# Patient Record
Sex: Male | Born: 1940 | Marital: Married | Smoking: Former smoker
Health system: Southern US, Community
[De-identification: ages and names within clinical notes are randomized; demographics above are authoritative.]

## PROBLEM LIST (undated history)

## (undated) DIAGNOSIS — C801 Malignant (primary) neoplasm, unspecified: Secondary | ICD-10-CM

## (undated) DIAGNOSIS — I2510 Atherosclerotic heart disease of native coronary artery without angina pectoris: Secondary | ICD-10-CM

## (undated) DIAGNOSIS — E079 Disorder of thyroid, unspecified: Secondary | ICD-10-CM

## (undated) DIAGNOSIS — E039 Hypothyroidism, unspecified: Secondary | ICD-10-CM

## (undated) DIAGNOSIS — I10 Essential (primary) hypertension: Secondary | ICD-10-CM

## (undated) DIAGNOSIS — I509 Heart failure, unspecified: Secondary | ICD-10-CM

## (undated) DIAGNOSIS — I4892 Unspecified atrial flutter: Secondary | ICD-10-CM

## (undated) DIAGNOSIS — E785 Hyperlipidemia, unspecified: Secondary | ICD-10-CM

## (undated) HISTORY — DX: Essential (primary) hypertension: I10

## (undated) HISTORY — PX: SPLENECTOMY, TOTAL: SHX788

## (undated) HISTORY — PX: CARDIAC CATHETERIZATION: SHX172

## (undated) HISTORY — DX: Atherosclerotic heart disease of native coronary artery without angina pectoris: I25.10

## (undated) HISTORY — DX: Disorder of thyroid, unspecified: E07.9

## (undated) HISTORY — DX: Hyperlipidemia, unspecified: E78.5

## (undated) HISTORY — DX: Unspecified atrial flutter: I48.92

---

## 2011-05-16 ENCOUNTER — Ambulatory Visit: Payer: Self-pay | Admitting: Internal Medicine

## 2011-09-23 ENCOUNTER — Telehealth: Payer: Self-pay

## 2011-09-23 NOTE — Telephone Encounter (Signed)
Error Encounter

## 2011-09-26 ENCOUNTER — Encounter: Payer: Self-pay | Admitting: Internal Medicine

## 2011-09-26 ENCOUNTER — Ambulatory Visit (INDEPENDENT_AMBULATORY_CARE_PROVIDER_SITE_OTHER): Payer: Medicare PPO | Admitting: Internal Medicine

## 2011-09-26 VITALS — BP 120/70 | HR 85 | Ht 73.0 in | Wt 207.0 lb

## 2011-09-26 DIAGNOSIS — I2510 Atherosclerotic heart disease of native coronary artery without angina pectoris: Secondary | ICD-10-CM

## 2011-09-26 DIAGNOSIS — I4892 Unspecified atrial flutter: Secondary | ICD-10-CM

## 2011-09-26 NOTE — Patient Instructions (Signed)
STOP Aspirin.  STOP Vytorin for now. Continue Cardizem (Diltiazem) 120mg  for now.  We will contact you regarding future procedure. Please call the office in the meantime with any questions or concerns.

## 2011-09-26 NOTE — Progress Notes (Signed)
HPI: Steven Lewis is a 70 y.o. male Seen at the request of Dr. Gwen Pounds consideration of catheter ablation of atrial flutter. He was initially referred to [facility] EP team but insurance did not allow for him to be done at [facility] and so he is referred here.  He presented earlier this year with fatigue and exercise intolerance. He was found to be in atrial "fibrillation" although all tracings dating back to May 2012 demonstrate typical atrial flutter. In addition he was having symptoms of syncope and presyncope; and about 4 weeks ago all of the symptoms abated. He comes in today in sinus rhythm.  He also has a history of coronary artery disease with prior stenting x2. Myoview scan April 2012 demonstrated normal left ventricular function and no evidence of prior infarction or ischemia. Echo at the same time demonstrated moderate biatrial enlargement with an atrial dimension of 3.8. He had mild aortic stenosis and moderate pulmonary hypertension.  Thromboembolic risk factors are notable for age-one, vascular disease-1; diabetes-1 for a CHADS VASC score of 3. Current Outpatient Prescriptions  Medication Sig Dispense Refill  . aspirin 81 MG tablet Take 81 mg by mouth daily.        Marland Kitchen diltiazem (CARDIZEM CD) 120 MG 24 hr capsule Take 120 mg by mouth daily.        Marland Kitchen ezetimibe-simvastatin (VYTORIN) 10-40 MG per tablet Take 1 tablet by mouth at bedtime.        . fexofenadine (ALLEGRA) 180 MG tablet Take 180 mg by mouth daily.        Marland Kitchen levothyroxine (SYNTHROID, LEVOTHROID) 75 MCG tablet Take 75 mcg by mouth daily.        . metFORMIN (GLUCOPHAGE) 500 MG tablet Take 500 mg by mouth daily with breakfast.        . omega-3 acid ethyl esters (LOVAZA) 1 G capsule Take 2 g by mouth daily.        . Rivaroxaban (XARELTO) 20 MG TABS Take 20 mg by mouth 1 day or 1 dose.          Allergies  Allergen Reactions  . Penicillins     Past Medical History  Diagnosis Date  . Hypertension   . Hyperlipidemia   . Diabetes  mellitus   . Thyroid disease     hypothyroidism  . Atrial flutter   . A-fib   . Coronary artery disease     stent placed x 2    Past Surgical History  Procedure Date  . Splenectomy, total   . Cardiac catheterization     stent placed x2    Family History  Problem Relation Age of Onset  . Heart disease Mother     History   Social History  . Marital Status: Married    Spouse Name: N/A    Number of Children: N/A  . Years of Education: N/A   Occupational History  . Not on file.   Social History Main Topics  . Smoking status: Former Smoker -- 1.0 packs/day for 25 years    Types: Cigarettes    Quit date: 12/06/1981  . Smokeless tobacco: Not on file  . Alcohol Use: No  . Drug Use: No  . Sexually Active: Not on file   Other Topics Concern  . Not on file   Social History Narrative  . No narrative on file    Fourteen point review of systems was negative except as noted in HPI and PMH except mild arthritis  PHYSICAL EXAMINATION  Blood  pressure 120/70, pulse 85, height 6\' 1"  (1.854 m), weight 207 lb (93.895 kg).   Well developed and nourished older Caucasian male appearing his stated age in no acute distress HENT normal Neck supple with JVP-flat Carotids brisk and full without bruits Back without scoliosis or kyphosis Clear Regular rate and rhythm, no murmurs or gallops; P2 was palpable Abd-soft with active BS without hepatomegaly or midline pulsation; well-healed midline scar Femoral pulses 2+ distal pulses intact No Clubbing cyanosis edema Skin-warm and dry LN-neg submandibular and supraclavicular A & Oriented CN 3-12 normal  Grossly normal sensory and motor function Affect engaging . Electrocardiogram dated today demonstrated sinus rhythm at 89 Intervals 0.18/0.09/0.36 there is evidence of interatrial conduction delaythe P wave duration of 120 ms.  Electro cardiograms from [facility] in May and July 2012 demonstrate typical atrial flutter with 31 and 21 AV  conduction

## 2011-09-26 NOTE — Assessment & Plan Note (Addendum)
Atrial flutter is present on elective cardiograms; atrial fibrillation as described on his Myoview from April 2012. Will be important to gather this information and confirm the presence or absence of atrial fibrillation at that time. In the event that represents atrial flutter, I would pursue catheter ablation. We have discussed risks benefits and alternatives. The reduction risk of recurrence, a reduction in risk of stroke and inability to eliminate long-term anticoagulation are all reasons to proceed with catheter ablation. The patient is agreeable and desires to proceed.  We will discontinue his aspirin as it likely just augments risk of bleeding without increasing benefit associated with his xarelto  In addition, because of the diltiazem simvastatin interaction, we will hold his Vytorin until following the procedure at which time his diltiazem can be discontinued

## 2011-09-26 NOTE — Assessment & Plan Note (Signed)
Stable

## 2011-09-28 ENCOUNTER — Other Ambulatory Visit: Payer: Self-pay | Admitting: Internal Medicine

## 2011-09-28 DIAGNOSIS — I4892 Unspecified atrial flutter: Secondary | ICD-10-CM

## 2011-09-28 DIAGNOSIS — Z01810 Encounter for preprocedural cardiovascular examination: Secondary | ICD-10-CM

## 2011-10-03 ENCOUNTER — Ambulatory Visit (INDEPENDENT_AMBULATORY_CARE_PROVIDER_SITE_OTHER): Payer: Medicare PPO | Admitting: *Deleted

## 2011-10-03 ENCOUNTER — Telehealth: Payer: Self-pay | Admitting: *Deleted

## 2011-10-03 DIAGNOSIS — I4892 Unspecified atrial flutter: Secondary | ICD-10-CM

## 2011-10-03 DIAGNOSIS — Z01810 Encounter for preprocedural cardiovascular examination: Secondary | ICD-10-CM

## 2011-10-03 NOTE — Telephone Encounter (Signed)
Notified pt to also not take Metformin the morning of his procedure 10/10 (I left this out of his instructions on letter.) Pt aware.

## 2011-10-04 LAB — CBC WITH DIFFERENTIAL/PLATELET
Eosinophils Absolute: 0.8 10*3/uL — ABNORMAL HIGH (ref 0.0–0.7)
Hemoglobin: 13.7 g/dL (ref 13.0–17.0)
Lymphocytes Relative: 19 % (ref 12–46)
Lymphs Abs: 2.1 10*3/uL (ref 0.7–4.0)
MCH: 30.9 pg (ref 26.0–34.0)
MCV: 96.2 fL (ref 78.0–100.0)
Monocytes Relative: 15 % — ABNORMAL HIGH (ref 3–12)
Neutrophils Relative %: 58 % (ref 43–77)
RBC: 4.43 MIL/uL (ref 4.22–5.81)

## 2011-10-04 LAB — BASIC METABOLIC PANEL
Chloride: 103 mEq/L (ref 96–112)
Creat: 0.76 mg/dL (ref 0.50–1.35)
Potassium: 5.3 mEq/L (ref 3.5–5.3)

## 2011-10-04 LAB — PROTIME-INR
INR: 1.46 (ref <1.50)
Prothrombin Time: 18.3 seconds — ABNORMAL HIGH (ref 11.6–15.2)

## 2011-10-05 ENCOUNTER — Ambulatory Visit (HOSPITAL_COMMUNITY)
Admission: RE | Admit: 2011-10-05 | Discharge: 2011-10-06 | Disposition: A | Discharge status: Home / Self Care | Payer: Medicare PPO | Source: Ambulatory Visit | Attending: Internal Medicine | Admitting: Internal Medicine

## 2011-10-05 DIAGNOSIS — I2510 Atherosclerotic heart disease of native coronary artery without angina pectoris: Secondary | ICD-10-CM | POA: Insufficient documentation

## 2011-10-05 DIAGNOSIS — E119 Type 2 diabetes mellitus without complications: Secondary | ICD-10-CM | POA: Insufficient documentation

## 2011-10-05 DIAGNOSIS — Z9861 Coronary angioplasty status: Secondary | ICD-10-CM | POA: Insufficient documentation

## 2011-10-05 DIAGNOSIS — I4892 Unspecified atrial flutter: Secondary | ICD-10-CM

## 2011-10-05 DIAGNOSIS — E785 Hyperlipidemia, unspecified: Secondary | ICD-10-CM | POA: Insufficient documentation

## 2011-10-05 DIAGNOSIS — E039 Hypothyroidism, unspecified: Secondary | ICD-10-CM | POA: Insufficient documentation

## 2011-10-05 DIAGNOSIS — I10 Essential (primary) hypertension: Secondary | ICD-10-CM | POA: Insufficient documentation

## 2011-10-05 LAB — GLUCOSE, CAPILLARY
Glucose-Capillary: 113 mg/dL — ABNORMAL HIGH (ref 70–99)
Glucose-Capillary: 94 mg/dL (ref 70–99)
Glucose-Capillary: 121 mg/dL — ABNORMAL HIGH (ref 70–99)

## 2011-10-06 LAB — GLUCOSE, CAPILLARY

## 2011-10-21 NOTE — Op Note (Signed)
NAME:  Steven Lewis, GLASSCOCK NO.:  [id number]  MEDICAL RECORD NO.:  [id number]  LOCATION:  3707                         FACILITY:  [facility]  PHYSICIAN:  Duke Salvia, MD, FACCDATE OF BIRTH:  29-Jan-1941  DATE OF PROCEDURE: DATE OF DISCHARGE:                              OPERATIVE REPORT   Steven Lewis has a history of recurrent atrial flutter.  He has been previously on Xarelto.  He comes to the lab today in sinus rhythm for catheter ablation.  PREOPERATIVE DIAGNOSIS:  Atrial flutter.  POSTOPERATIVE DIAGNOSIS:  Atrial flutter.  PROCEDURES:  Invasive electrophysiological study, electrogram, mapping catheter ablation.  Following obtaining informed consent, the patient was brought to the Electrophysiology Laboratory and placed on the fluoroscopic table in supine position.  After routine prep and drape, cardiac catheterization was performed with local anesthesia and conscious sedation.  Noninvasive blood pressure monitoring and transcutaneous oxygen saturation monitoring were performed continuously throughout the procedure. Following the procedure, the catheters were removed.  Hemostasis was obtained.  The patient was transferred to the holding area in stable condition.  CATHETERS: 1. 5-French quadripolar catheter was inserted via the left femoral     vein to the AV junction. 2. 6-French octapolar catheter was inserted via the right femoral vein     to the coronary. 3. 7-French dual decapolar catheter was inserted via the left femoral     vein to the tricuspid anulus. 4. 8-French 8-mm deflectable tip catheter was inserted via the right     femoral vein to mapping sites and posterior septal space. Surface leads, I, aVF, and V1 were monitored continuously throughout the procedure.  Following insertion of the catheters, a stimulation protocol included incremental atrial pacing. Incremental ventricular pacing. Single atrial extrastimuli at paced cycle length  of 500 msec.  END-TIDAL RESULT:  End-tidal surface electrocardiogram and basic intervals: Rhythm:  Initial; RR interval 685 msec; rhythm:  Sinus; PR interval 148 msec; QRS duration:  94 msec; QTc interval:  359 msec. AH intervals 95 msec, HV interval 59 msec.  Final:  Rhythm:  Sinus; RR interval 709 msec; P-wave duration 125 msec; PR interval 156 msec; QRS duration 101 msec; acute interval 362 msec. AH interval of 121 msec; HV interval 57 msec.  END-TIDAL ARTERIOVENOUS NODAL FUNCTION:  AV Wenckebach of 400 msec. Retrograde conduction was dissociated at 500 msec. AV nodal effective refractory period of 500 msec at 320 msec without evidence of dual AV nodal physiology.  Accessory pathway function:  No evidence of accessory pathway was identified.  Ventricular response programed stimulation was normal for ventricular stimulation as described.  Arrhythmias induced.  The patient was arrived from the lab in sinus rhythm.  Coronary sinus pacing was used to identify cavotricuspid isthmus conduction.  Radiofrequency energy was delivered across the cavotricuspid isthmus with a total of 5 RF and a total of 3 minutes and 24 seconds of RF energy which resulted in bidirectional isthmus block and AA interval of 140 msec.  Fluoroscopy time, a total of 5.8 minutes of fluoroscopy time was utilized at 15 frames per second.  IMPRESSION: 1. Normal sinus function. 2. Abnormal atrial function manifested by sustained atrial flutter -     recurrent,  successfully submitted for ablation of the cavotricuspid     isthmus. 3. Normal arteriovenous nodal function. 4. Normal His-Purkinje system function. 5. No accessory pathway. 6. Normal ventricular response programed stimulation.  SUMMARY CONCLUSION:  The results of electrophysiological testing confirmed cavotricuspid isthmus conduction and its presents with a history of atrial flutter served as the substrate for arrhythmia ablation.  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  The patient's Xarelto will be discontinued.  The patient will be observed overnight and discharged in the morning.  No apparent complications.     Duke Salvia, MD, [facility]     SCK/MEDQ  D:  10/05/2011  T:  10/05/2011  Job:  [id number]  cc:   Arnoldo Hooker, MD  Electronically Signed by Sherryl Manges MD [facility] on 10/21/2011 04:54:09 AM

## 2011-10-21 NOTE — Discharge Summary (Signed)
NAME:  Steven, Lewis NO.:  [id number]  MEDICAL RECORD NO.:  [id number]  LOCATION:  3707                         FACILITY:  [facility]  PHYSICIAN:  Duke Salvia, MD, FACCDATE OF BIRTH:  Sep 29, 1941  DATE OF ADMISSION:  10/05/2011 DATE OF DISCHARGE:  10/06/2011                              DISCHARGE SUMMARY   PRIMARY CARDIOLOGIST:  Arnoldo Hooker, MD  ELECTROPHYSIOLOGIST:  Duke Salvia, MD, [facility]  PRIMARY DIAGNOSIS:  Atrial flutter status post ablation this admission.  SECONDARY DIAGNOSES: 1. Coronary artery disease status post stenting x2 with most recent     Myoview in April 2012 demonstrating a normal left ventricular     function and no evidence of prior infarct or ischemia. 2. Hypertension. 3. Hyperlipidemia. 4. Diabetes. 5. Hypothyroidism. 6. Status post splenectomy.  ALLERGIES:  The patient is allergic to PENICILLIN.  PROCEDURES THIS ADMISSION:  Electrophysiology study and radiofrequency catheter ablation of atrial flutter on October 05, 2011 by Dr. Graciela Husbands. This demonstrated cavotricuspid isthmus conduction that was successfully ablated.  The patient had no early apparent complications.  BRIEF HISTORY OF PRESENT ILLNESS:  Steven Lewis was referred to Dr. Graciela Husbands in the outpatient setting for consideration of catheter ablation of atrial flutter.  He presented earlier this year with fatigue and exercise intolerance and was found to be in "atrial fibrillation," although tracing reviewed by Dr. Graciela Husbands demonstrated typical atrial flutter.  He was also having symptoms of syncope and presyncope.  About 4 weeks prior to evaluation symptoms, his abated and he presented to Dr. Graciela Husbands in the outpatient setting in sinus rhythm.  Risks benefits alternatives of ablation were reviewed with the patient and he wished to proceed.  HOSPITAL COURSE:  The patient was admitted on October 05, 2011 for planned ablation of atrial flutter.  This was carried out by Dr.  Graciela Husbands with details outlined above.  He was monitored on telemetry overnight which demonstrated sinus rhythm.  His groin incisions were without hematoma or bruit.  Dr. Graciela Husbands examined the patient on October 06, 2011 and considered him stable for discharge to home.  Plans will be to discontinue Xarelto and diltiazem and continue aspirin for 6 weeks.  The patient will follow up with Dr. Graciela Husbands in the [place] office in 4 weeks.  FOLLOWUP APPOINTMENTS: 1. Dr. Graciela Husbands in the [facility] office on November 14, 2011 at     10:30 a.m. 2. Dr. Gwen Pounds as scheduled.  DISCHARGE INSTRUCTIONS: 1. Increase activity slowly. 2. No driving for 3 days. 3. Follow a low-sodium heart healthy diet. 4. Keep incisions clean and dry.  DISCHARGE MEDICATIONS: 1. Aspirin 81 mg 1 tablet daily. 2. Multivitamin daily. 3. Levothroid 75 mcg daily. 4. Lipitor 80 mg daily. 5. Metformin 500 mg daily. 6. Fexofenadine 180 mg daily.  DISPOSITION:  The patient was seen and examined by Dr. Graciela Husbands on October 06, 2011 and considered stable for discharge.  DURATION OF DISCHARGE ENCOUNTER:  35 minutes.     Gypsy Balsam, RN,BSN   ______________________________ Duke Salvia, MD, [facility]    AS/MEDQ  D:  10/06/2011  T:  10/06/2011  Job:  [id number]  cc:   Arnoldo Hooker, MD  Electronically Signed by Gypsy Balsam  RNBSN on 10/10/2011 10:16:14 AM Electronically Signed by Sherryl Manges MD [facility] on 10/21/2011 45:40:98 AM

## 2011-11-14 ENCOUNTER — Encounter: Payer: Medicare PPO | Admitting: Internal Medicine

## 2012-10-25 ENCOUNTER — Ambulatory Visit: Payer: Self-pay | Admitting: Unknown Physician Specialty

## 2015-06-18 ENCOUNTER — Telehealth: Payer: Self-pay | Admitting: Family Medicine

## 2015-06-18 DIAGNOSIS — J3089 Other allergic rhinitis: Secondary | ICD-10-CM

## 2015-06-18 MED ORDER — FLUTICASONE PROPIONATE 50 MCG/ACT NA SUSP: 2.0000 | Freq: Every day | NASAL | Status: DC

## 2015-06-18 NOTE — Telephone Encounter (Signed)
Pt called wants to know if Dr. Jeananne Rama can call in Flonase nasal spray for him. Pharm is [facility].

## 2015-06-23 ENCOUNTER — Other Ambulatory Visit: Payer: Self-pay | Admitting: Family Medicine

## 2015-06-23 DIAGNOSIS — J3089 Other allergic rhinitis: Secondary | ICD-10-CM

## 2015-06-23 MED ORDER — FLUTICASONE PROPIONATE 50 MCG/ACT NA SUSP: 2.0000 | Freq: Every day | NASAL | Status: AC

## 2015-07-03 ENCOUNTER — Encounter: Payer: Self-pay | Admitting: Emergency Medicine

## 2015-07-03 ENCOUNTER — Inpatient Hospital Stay
Admission: EM | Admit: 2015-07-03 | Discharge: 2015-07-04 | DRG: 176 | Disposition: A | Discharge status: Hospice | Payer: PPO | Attending: Internal Medicine | Admitting: Internal Medicine

## 2015-07-03 ENCOUNTER — Inpatient Hospital Stay
Admit: 2015-07-03 | Discharge: 2015-07-03 | Disposition: A | Discharge status: Home / Self Care | Payer: PPO | Attending: Internal Medicine | Admitting: Internal Medicine

## 2015-07-03 ENCOUNTER — Emergency Department: Payer: PPO

## 2015-07-03 DIAGNOSIS — Z803 Family history of malignant neoplasm of breast: Secondary | ICD-10-CM

## 2015-07-03 DIAGNOSIS — I10 Essential (primary) hypertension: Secondary | ICD-10-CM | POA: Diagnosis present

## 2015-07-03 DIAGNOSIS — Z800 Family history of malignant neoplasm of digestive organs: Secondary | ICD-10-CM

## 2015-07-03 DIAGNOSIS — N2889 Other specified disorders of kidney and ureter: Secondary | ICD-10-CM | POA: Diagnosis present

## 2015-07-03 DIAGNOSIS — Z794 Long term (current) use of insulin: Secondary | ICD-10-CM

## 2015-07-03 DIAGNOSIS — R197 Diarrhea, unspecified: Secondary | ICD-10-CM

## 2015-07-03 DIAGNOSIS — R1900 Intra-abdominal and pelvic swelling, mass and lump, unspecified site: Secondary | ICD-10-CM | POA: Diagnosis present

## 2015-07-03 DIAGNOSIS — E785 Hyperlipidemia, unspecified: Secondary | ICD-10-CM | POA: Diagnosis present

## 2015-07-03 DIAGNOSIS — Z79899 Other long term (current) drug therapy: Secondary | ICD-10-CM

## 2015-07-03 DIAGNOSIS — I5032 Chronic diastolic (congestive) heart failure: Secondary | ICD-10-CM | POA: Diagnosis present

## 2015-07-03 DIAGNOSIS — Z7982 Long term (current) use of aspirin: Secondary | ICD-10-CM | POA: Diagnosis not present

## 2015-07-03 DIAGNOSIS — Z955 Presence of coronary angioplasty implant and graft: Secondary | ICD-10-CM | POA: Diagnosis not present

## 2015-07-03 DIAGNOSIS — I2510 Atherosclerotic heart disease of native coronary artery without angina pectoris: Secondary | ICD-10-CM | POA: Diagnosis present

## 2015-07-03 DIAGNOSIS — Z809 Family history of malignant neoplasm, unspecified: Secondary | ICD-10-CM | POA: Diagnosis not present

## 2015-07-03 DIAGNOSIS — R0602 Shortness of breath: Secondary | ICD-10-CM

## 2015-07-03 DIAGNOSIS — I4892 Unspecified atrial flutter: Secondary | ICD-10-CM | POA: Diagnosis present

## 2015-07-03 DIAGNOSIS — E079 Disorder of thyroid, unspecified: Secondary | ICD-10-CM

## 2015-07-03 DIAGNOSIS — Z880 Allergy status to penicillin: Secondary | ICD-10-CM | POA: Diagnosis not present

## 2015-07-03 DIAGNOSIS — I4891 Unspecified atrial fibrillation: Secondary | ICD-10-CM

## 2015-07-03 DIAGNOSIS — K8689 Other specified diseases of pancreas: Secondary | ICD-10-CM

## 2015-07-03 DIAGNOSIS — E039 Hypothyroidism, unspecified: Secondary | ICD-10-CM | POA: Diagnosis present

## 2015-07-03 DIAGNOSIS — R918 Other nonspecific abnormal finding of lung field: Secondary | ICD-10-CM | POA: Diagnosis present

## 2015-07-03 DIAGNOSIS — C787 Secondary malignant neoplasm of liver and intrahepatic bile duct: Secondary | ICD-10-CM | POA: Diagnosis present

## 2015-07-03 DIAGNOSIS — K869 Disease of pancreas, unspecified: Secondary | ICD-10-CM | POA: Diagnosis not present

## 2015-07-03 DIAGNOSIS — C251 Malignant neoplasm of body of pancreas: Secondary | ICD-10-CM | POA: Diagnosis present

## 2015-07-03 DIAGNOSIS — Z87891 Personal history of nicotine dependence: Secondary | ICD-10-CM

## 2015-07-03 DIAGNOSIS — I480 Paroxysmal atrial fibrillation: Secondary | ICD-10-CM | POA: Diagnosis present

## 2015-07-03 DIAGNOSIS — F17210 Nicotine dependence, cigarettes, uncomplicated: Secondary | ICD-10-CM

## 2015-07-03 DIAGNOSIS — I5189 Other ill-defined heart diseases: Secondary | ICD-10-CM | POA: Diagnosis present

## 2015-07-03 DIAGNOSIS — R531 Weakness: Secondary | ICD-10-CM

## 2015-07-03 DIAGNOSIS — I2699 Other pulmonary embolism without acute cor pulmonale: Secondary | ICD-10-CM | POA: Diagnosis not present

## 2015-07-03 DIAGNOSIS — R630 Anorexia: Secondary | ICD-10-CM

## 2015-07-03 DIAGNOSIS — E119 Type 2 diabetes mellitus without complications: Secondary | ICD-10-CM | POA: Diagnosis present

## 2015-07-03 DIAGNOSIS — I429 Cardiomyopathy, unspecified: Secondary | ICD-10-CM

## 2015-07-03 DIAGNOSIS — R5383 Other fatigue: Secondary | ICD-10-CM

## 2015-07-03 HISTORY — DX: Hypothyroidism, unspecified: E03.9

## 2015-07-03 HISTORY — DX: Heart failure, unspecified: I50.9

## 2015-07-03 HISTORY — DX: Malignant (primary) neoplasm, unspecified: C80.1

## 2015-07-03 LAB — COMPREHENSIVE METABOLIC PANEL
ALBUMIN: 3.6 g/dL (ref 3.5–5.0)
ALK PHOS: 325 U/L — AB (ref 38–126)
ALT: 38 U/L (ref 17–63)
ANION GAP: 10 (ref 5–15)
AST: 48 U/L — ABNORMAL HIGH (ref 15–41)
BUN: 30 mg/dL — AB (ref 6–20)
CALCIUM: 8.9 mg/dL (ref 8.9–10.3)
CO2: 27 mmol/L (ref 22–32)
Chloride: 99 mmol/L — ABNORMAL LOW (ref 101–111)
Creatinine, Ser: 0.99 mg/dL (ref 0.61–1.24)
GFR calc Af Amer: >60 mL/min (ref >60)
GFR calc non Af Amer: >60 mL/min (ref >60)
Glucose, Bld: 175 mg/dL — ABNORMAL HIGH (ref 65–99)
POTASSIUM: 4.9 mmol/L (ref 3.5–5.1)
Sodium: 136 mmol/L (ref 135–145)
TOTAL PROTEIN: 8 g/dL (ref 6.5–8.1)
Total Bilirubin: 0.9 mg/dL (ref 0.3–1.2)

## 2015-07-03 LAB — CBC WITH DIFFERENTIAL/PLATELET
Basophils Absolute: 0.1 10*3/uL (ref 0–0.1)
Basophils Relative: 1 %
EOS PCT: 0 %
Eosinophils Absolute: 0 10*3/uL (ref 0–0.7)
HCT: 37.9 % — ABNORMAL LOW (ref 40.0–52.0)
Hemoglobin: 12.3 g/dL — ABNORMAL LOW (ref 13.0–18.0)
LYMPHS ABS: 1 10*3/uL (ref 1.0–3.6)
LYMPHS PCT: 10 %
MCH: 30.4 pg (ref 26.0–34.0)
MCHC: 32.5 g/dL (ref 32.0–36.0)
MCV: 93.6 fL (ref 80.0–100.0)
Monocytes Absolute: 1.2 10*3/uL — ABNORMAL HIGH (ref 0.2–1.0)
Monocytes Relative: 11 %
NEUTROS PCT: 78 %
Neutro Abs: 8 10*3/uL — ABNORMAL HIGH (ref 1.4–6.5)
PLATELETS: 309 10*3/uL (ref 150–440)
RBC: 4.05 MIL/uL — AB (ref 4.40–5.90)
RDW: 14.2 % (ref 11.5–14.5)
WBC: 10.3 10*3/uL (ref 3.8–10.6)

## 2015-07-03 LAB — GLUCOSE, CAPILLARY
Glucose-Capillary: 118 mg/dL — ABNORMAL HIGH (ref 65–99)
Glucose-Capillary: 155 mg/dL — ABNORMAL HIGH (ref 65–99)

## 2015-07-03 LAB — TROPONIN I: Troponin I: 0.04 ng/mL — ABNORMAL HIGH (ref <0.031)

## 2015-07-03 LAB — TSH: TSH: 4.245 u[IU]/mL (ref 0.350–4.500)

## 2015-07-03 LAB — HEPARIN LEVEL (UNFRACTIONATED): HEPARIN UNFRACTIONATED: 0.58 [IU]/mL (ref 0.30–0.70)

## 2015-07-03 LAB — APTT: APTT: 29 s (ref 24–36)

## 2015-07-03 LAB — PROTIME-INR
INR: 1
Prothrombin Time: 13.4 seconds (ref 11.4–15.0)

## 2015-07-03 MED ORDER — ASPIRIN 81 MG PO CHEW
324.0000 mg | CHEWABLE_TABLET | Freq: Once | ORAL | Status: AC
  Administered 2015-07-03: 324 mg via ORAL
  Filled 2015-07-03: qty 4

## 2015-07-03 MED ORDER — LEVOTHYROXINE SODIUM 75 MCG PO TABS
75.0000 ug | ORAL_TABLET | Freq: Every day | ORAL | Status: DC
  Administered 2015-07-04: 75 ug via ORAL
  Filled 2015-07-03: qty 1

## 2015-07-03 MED ORDER — ACETAMINOPHEN 325 MG PO TABS
650.0000 mg | ORAL_TABLET | Freq: Four times a day (QID) | ORAL | Status: DC | PRN
Start: 2015-07-03 — End: 2015-07-04

## 2015-07-03 MED ORDER — ASPIRIN EC 81 MG PO TBEC
81.0000 mg | DELAYED_RELEASE_TABLET | Freq: Every day | ORAL | Status: DC
  Administered 2015-07-04: 81 mg via ORAL
  Filled 2015-07-03: qty 1

## 2015-07-03 MED ORDER — HEPARIN (PORCINE) IN NACL 100-0.45 UNIT/ML-% IJ SOLN
1250.0000 [IU]/h | INTRAMUSCULAR | Status: DC
  Filled 2015-07-03 (×2): qty 250

## 2015-07-03 MED ORDER — ACETAMINOPHEN 650 MG RE SUPP: 650.0000 mg | Freq: Four times a day (QID) | RECTAL | Status: DC | PRN

## 2015-07-03 MED ORDER — SODIUM CHLORIDE 0.9 % IJ SOLN
3.0000 mL | Freq: Two times a day (BID) | INTRAMUSCULAR | Status: DC
  Administered 2015-07-03 – 2015-07-04 (×3): 3 mL via INTRAVENOUS

## 2015-07-03 MED ORDER — DILTIAZEM HCL ER COATED BEADS 120 MG PO CP24
120.0000 mg | ORAL_CAPSULE | Freq: Every day | ORAL | Status: DC
  Administered 2015-07-04: 120 mg via ORAL
  Filled 2015-07-03: qty 1

## 2015-07-03 MED ORDER — LORATADINE 10 MG PO TABS
10.0000 mg | ORAL_TABLET | Freq: Every day | ORAL | Status: DC
  Administered 2015-07-04: 10 mg via ORAL
  Filled 2015-07-03: qty 1

## 2015-07-03 MED ORDER — IOHEXOL 350 MG/ML SOLN
100.0000 mL | Freq: Once | INTRAVENOUS | Status: AC | PRN
  Administered 2015-07-03: 100 mL via INTRAVENOUS

## 2015-07-03 MED ORDER — MAGNESIUM HYDROXIDE 400 MG/5ML PO SUSP: 30.0000 mL | Freq: Every day | ORAL | Status: DC | PRN

## 2015-07-03 MED ORDER — ENOXAPARIN SODIUM 80 MG/0.8ML ~~LOC~~ SOLN
1.0000 mg/kg | Freq: Two times a day (BID) | SUBCUTANEOUS | Status: DC
  Administered 2015-07-03 – 2015-07-04 (×2): 75 mg via SUBCUTANEOUS
  Filled 2015-07-03 (×4): qty 0.8

## 2015-07-03 MED ORDER — OMEGA-3-ACID ETHYL ESTERS 1 G PO CAPS
2.0000 g | ORAL_CAPSULE | Freq: Every day | ORAL | Status: DC
  Administered 2015-07-04: 2 g via ORAL
  Filled 2015-07-03: qty 2

## 2015-07-03 MED ORDER — INSULIN ASPART 100 UNIT/ML ~~LOC~~ SOLN: 0.0000 [IU] | Freq: Every day | SUBCUTANEOUS | Status: DC

## 2015-07-03 MED ORDER — ALUM & MAG HYDROXIDE-SIMETH 200-200-20 MG/5ML PO SUSP: 30.0000 mL | Freq: Four times a day (QID) | ORAL | Status: DC | PRN

## 2015-07-03 MED ORDER — INSULIN ASPART 100 UNIT/ML ~~LOC~~ SOLN
0.0000 [IU] | Freq: Three times a day (TID) | SUBCUTANEOUS | Status: DC
  Administered 2015-07-04: 1 [IU] via SUBCUTANEOUS
  Filled 2015-07-03: qty 1

## 2015-07-03 MED ORDER — IOHEXOL 240 MG/ML SOLN
25.0000 mL | Freq: Once | INTRAMUSCULAR | Status: AC | PRN
  Administered 2015-07-03: 25 mL via ORAL

## 2015-07-03 MED ORDER — PANTOPRAZOLE SODIUM 40 MG PO TBEC
40.0000 mg | DELAYED_RELEASE_TABLET | Freq: Every day | ORAL | Status: DC
  Administered 2015-07-04: 40 mg via ORAL
  Filled 2015-07-03: qty 1

## 2015-07-03 MED ORDER — HEPARIN BOLUS VIA INFUSION
4500.0000 [IU] | Freq: Once | INTRAVENOUS | Status: DC
  Filled 2015-07-03: qty 4500

## 2015-07-03 MED ORDER — FLUTICASONE PROPIONATE 50 MCG/ACT NA SUSP
2.0000 | Freq: Every day | NASAL | Status: DC
  Administered 2015-07-04: 2 via NASAL
  Filled 2015-07-03: qty 16

## 2015-07-03 NOTE — ED Notes (Signed)
Pt with shortness of breath and diarrhea for three weeks.

## 2015-07-03 NOTE — Progress Notes (Signed)
   07/03/15 1700  Clinical Encounter Type  Visited With Patient  Visit Type Initial;Psychological support;Social support  Referral From Nurse  Consult/Referral To Chaplain  Spiritual Encounters  Spiritual Needs Emotional;Grief support  Stress Factors  Patient Stress Factors Major life changes;Loss of control;Health changes;Loss  Met w/patient and provided compassionate company to listen.  Chap. Danny G. Nobles, ext. [phone] 

## 2015-07-03 NOTE — ED Provider Notes (Signed)
[facility] Emergency Department Provider Note   ____________________________________________  Time seen: 10:45 AM I have reviewed the triage vital signs and the triage nursing note.  HISTORY  Chief Complaint Diarrhea   Historian Patient and significant other  HPI Steven Lewis is a 74 y.o. male who is here complaining of shortness of breath, abdominal pain with nausea and diarrhea for about 3 weeks, and an 80 pound weight loss over the past year. He is followed with GI in the past but states he hasn't been seen there for about a year and a half. He tried to get an appointment was to be 6+ weeks. He's had problems with constipation alternating with diarrhea. The last 3 weeks have been very bad with diarrhea, decreased by mouth intake due to no appetite, as well as abdominal fullness and epigastric pain associated also with nausea. There is been no vomiting. There is been no black or bloody stools. He reports some shortness of breath which has been chronic but seems to be much worse over the past several weeks. He's had no cough or fever or sputum production. 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    Past Medical History  Diagnosis Date  . Hyperlipidemia   . Diabetes mellitus   . Thyroid disease     hypothyroidism  . Atrial flutter   . Coronary artery disease     stent placed x 2  . Cancer   . CHF (congestive heart failure)     Patient Active Problem List   Diagnosis Date Noted  . Atrial flutter   . Coronary artery disease     Past Surgical History  Procedure Laterality Date  . Splenectomy, total    . Cardiac catheterization      stent placed x2    Current Outpatient Rx  Name  Route  Sig  Dispense  Refill  . aspirin EC 81 MG tablet   Oral   Take 81 mg by mouth daily.         Marland Kitchen diltiazem (CARDIZEM CD) 120 MG 24 hr capsule   Oral   Take 120 mg by mouth daily.           . fexofenadine (ALLEGRA) 180 MG  tablet   Oral   Take 180 mg by mouth daily.           . fluticasone (FLONASE) 50 MCG/ACT nasal spray   Each Nare   Place 2 sprays into both nostrils daily.   16 g   12   . levothyroxine (SYNTHROID, LEVOTHROID) 75 MCG tablet   Oral   Take 75 mcg by mouth daily.           . metFORMIN (GLUCOPHAGE) 500 MG tablet   Oral   Take 500 mg by mouth daily with breakfast.           . omega-3 acid ethyl esters (LOVAZA) 1 G capsule   Oral   Take 2 g by mouth daily.             Allergies Penicillins  Family History  Problem Relation Age of Onset  . Heart disease Mother     Social History History  Substance Use Topics  . Smoking status: Former Smoker -- 1.00 packs/day for 25 years    Types: Cigarettes    Quit date: 12/06/1981  . Smokeless tobacco: Not on file  . Alcohol Use: No    Review of Systems  Constitutional:  Negative for fever. Eyes: Negative for visual changes. ENT: Negative for sore throat. Cardiovascular: Negative for chest pain. Respiratory: Positive for shortness of breath, per history of present illness. Gastrointestinal: Positive for nausea, abdominal pain in the epigastrium, and diarrhea without any vomiting as per history of present illness Genitourinary: Negative for dysuria. Musculoskeletal: Negative for back pain. Skin: Negative for rash. Neurological: Negative for headaches, focal weakness or numbness. 10 point Review of Systems otherwise negative ____________________________________________   PHYSICAL EXAM:  VITAL SIGNS: ED Triage Vitals  Enc Vitals Group     BP 07/03/15 0937 127/69 mmHg     Pulse Rate 07/03/15 0937 112     Resp 07/03/15 0937 18     Temp 07/03/15 0937 97.6 F (36.4 C)     Temp Source 07/03/15 0937 Oral     SpO2 07/03/15 0937 98 %     Weight 07/03/15 0937 164 lb (74.39 kg)     Height 07/03/15 0937 6\' 1"  (1.854 m)     Head Cir --      Peak Flow --      Pain Score 07/03/15 0938 2     Pain Loc --      Pain Edu? --       Excl. in [place]? --      Constitutional: Alert and oriented. Short of breath with speech.. Eyes: Conjunctivae are normal. PERRL. Normal extraocular movements. ENT   Head: Normocephalic and atraumatic.   Nose: No congestion/rhinnorhea.   Mouth/Throat: Mucous membranes are moist.   Neck: No stridor. Cardiovascular/Chest: Tachycardic, but regular..  No murmurs, rubs, or gallops. Respiratory: No wheezes or rhonchi, however patient has decreased air movement throughout all fields with some tachypnea. There are no retractions. Patient is short of breath with speech. Gastrointestinal: Soft. No distention, no guarding, no rebound. Questionable mass versus scar tissue in the left upper quadrant over the site of prior spleen removal. Soft lower abdomen. Mild tenderness of the left side abdomen  Genitourinary/rectal:Deferred Musculoskeletal: Nontender with normal range of motion in all extremities. No joint effusions.  No lower extremity tenderness nor edema. Neurologic:  Normal speech and language. No gross focal neurologic deficits are appreciated. Skin:  Skin is warm, dry and intact. No rash noted. Psychiatric: Mood and affect are normal. Speech and behavior are normal. Patient exhibits appropriate insight and judgment.  ____________________________________________   EKG I, Lisa Roca, MD, the attending physician have personally viewed and interpreted all ECGs.  110 bpm. Sinus tachycardia. Narrow QRS. Left axis deviation. Nonspecific ST and T-wave. ____________________________________________  LABS (pertinent positives/negatives)  White blood cell count 10.3, hemoglobin 51.7 Metabolic panel significant for BUN of 30 but creatinine is just 0.99. Alkaline phosphatase 325, AST 48 with a normal a LT of 38 Troponin 0.04  ____________________________________________  RADIOLOGY All Xrays were viewed by me. Imaging interpreted by Radiologist. Discussed by phone with the reading  radiologist.  CT chest angiogram: Positive for pulmonary embolism with signs of right heart strain CT chest abdomen and pelvis with contrast: Multiple concerning findings including metastatic disease to the liver, pancreatic mass which may be primary pancreatic cancer versus adenocarcinoma, pleural effusions, ascites __________________________________________  PROCEDURES  Procedure(s) performed: None Critical Care performed: None  ____________________________________________   ED COURSE / ASSESSMENT AND PLAN  CONSULTATIONS: Face-to-face with hospitalist for admission  Pertinent labs & imaging results that were available during my care of the patient were reviewed by me and considered in my medical decision making (see chart for details).  Patient's symptoms were  concerning for possibility of abdominal mass, and shortness of breath led me to also CT the chest to rule out possible PE versus other lung pathology.  I discussed the CT scan results with the radiologist which shows multiple very concerning findings including new abdominal masses with widely metastatic disease as well as pulmonary emboli with signs of right heart strain. I ordered heparin. I discussed with the hospitalist for admission. I updated the patient and his significant other with this information.   Patient / Family / Caregiver informed of clinical course, medical decision-making process, and agree with plan.   I discussed return precautions, follow-up instructions, and discharged instructions with patient and/or family.  ___________________________________________   FINAL CLINICAL IMPRESSION(S) / ED DIAGNOSES   Final diagnoses:  Pulmonary embolism  Metastasis to liver  Pancreatic mass      Lisa Roca, MD 07/03/15 1302

## 2015-07-03 NOTE — Consult Note (Signed)
[facility]  CARDIOLOGY CONSULT NOTE  Patient ID: Steven Lewis MRN: [id number] DOB/AGE: 75-Sep-1942 75 y.o.  Admit date: 07/03/2015 Referring Physician Dr. Volanda Napoleon Primary Physician   Primary Cardiologist Dr. Nehemiah Massed Reason for Consultation pulmonary embolus  HPI: Patient is a 74 year old male with history of hypertensive heart disease. He also has a history of coronary artery disease status post PCI of the left circumflex. This was carried out in 2002. He has done fairly well since that time. The remainder of his vessels were insignificant. He has normal left ventricular function by echocardiogram with mild AI. Patient presented to the emergency room with several days of shortness of breath. He denied any recent lower extremity trauma or travel. In the emergency room he was evaluated with a chest CT which revealed bilateral pulmonary emboli. He was also noted to have mediastinal lymph nodes. There was also a 2.4 x 1.1 cm nodule in the right upper lobe of lung. There were multiple lesions within the liver consistent with metastatic disease. The largest of which was 9.2 x 9.1 cm in the left hepatic lobe. There was a 5.6 x 2.7 cm mass in the pancreatic body and tail. He also had ascites in the abdomen. Patient is hemodynamically stable. His serum troponin is normal at 0.031. EKG revealed sinus rhythm with RV strain pattern. There are no ischemic changes. Patient denies chest pain but complains of weight loss and shortness of breath.  ROS Review of Systems - History obtained from chart review and the patient General ROS: positive for  - fatigue and weight loss Respiratory ROS: positive for - shortness of breath Cardiovascular ROS: no chest pain or dyspnea on exertion Gastrointestinal ROS: no abdominal pain, change in bowel habits, or black or bloody stools Neurological ROS: no TIA or stroke symptoms   Past Medical History  Diagnosis Date  . Hyperlipidemia    . Diabetes mellitus   . Thyroid disease     hypothyroidism  . Atrial flutter   . Coronary artery disease     stent placed x 2  . Cancer   . CHF (congestive heart failure)   . Hypertension   . Hypothyroidism     Family History  Problem Relation Age of Onset  . Heart disease Mother   . Breast cancer Mother   . Cancer Father   . Colon cancer Sister     History   Social History  . Marital Status: Married    Spouse Name: N/A  . Number of Children: N/A  . Years of Education: N/A   Occupational History  . Not on file.   Social History Main Topics  . Smoking status: Former Smoker -- 1.00 packs/day for 25 years    Types: Cigarettes    Quit date: 12/06/1981  . Smokeless tobacco: Not on file  . Alcohol Use: No  . Drug Use: No  . Sexual Activity: Not on file   Other Topics Concern  . Not on file   Social History Narrative    Past Surgical History  Procedure Laterality Date  . Splenectomy, total    . Cardiac catheterization      stent placed x2     Prescriptions prior to admission  Medication Sig Dispense Refill Last Dose  . aspirin EC 81 MG tablet Take 81 mg by mouth daily.   07/03/2015 at Unknown time  . diltiazem (CARDIZEM CD) 120 MG 24 hr capsule Take 120 mg by mouth daily.  07/03/2015 at Unknown time  . fexofenadine (ALLEGRA) 180 MG tablet Take 180 mg by mouth daily.     07/03/2015 at Unknown time  . fluticasone (FLONASE) 50 MCG/ACT nasal spray Place 2 sprays into both nostrils daily. 16 g 12 07/03/2015 at Unknown time  . levothyroxine (SYNTHROID, LEVOTHROID) 75 MCG tablet Take 75 mcg by mouth daily.     07/03/2015 at Unknown time  . metFORMIN (GLUCOPHAGE) 500 MG tablet Take 500 mg by mouth daily with breakfast.     07/03/2015 at Unknown time  . omega-3 acid ethyl esters (LOVAZA) 1 G capsule Take 2 g by mouth daily.     07/03/2015 at Unknown time    Physical Exam: Blood pressure 131/70, pulse 106, temperature 98.3 F (36.8 C), temperature source Oral, resp. rate 18,  height 6\' 1"  (1.854 m), weight 73.12 kg (161 lb 3.2 oz), SpO2 95 %. .   General appearance: alert and cooperative Neck: no adenopathy, no carotid bruit, no JVD, supple, symmetrical, trachea midline and thyroid not enlarged, symmetric, no tenderness/mass/nodules Resp: clear to auscultation bilaterally Chest wall: no tenderness Cardio: regular rate and rhythm, S1, S2 normal, no murmur, click, rub or gallop and normal apical impulse GI: normal findings: bowel sounds normal and abnormal findings:  ascites Extremities: extremities normal, atraumatic, no cyanosis or edema Pulses: 2+ and symmetric Neurologic: Grossly normal Labs:   Lab Results  Component Value Date   WBC 10.3 07/03/2015   HGB 12.3* 07/03/2015   HCT 37.9* 07/03/2015   MCV 93.6 07/03/2015   PLT 309 07/03/2015    Recent Labs Lab 07/03/15 0939  NA 136  K 4.9  CL 99*  CO2 27  BUN 30*  CREATININE 0.99  CALCIUM 8.9  PROT 8.0  BILITOT 0.9  ALKPHOS 325*  ALT 38  AST 48*  GLUCOSE 175*   Lab Results  Component Value Date   TROPONINI 0.04* 07/03/2015      Radiology: As per above. Chest CT revealed bilateral pulmonary emboli with multiple abdominal and chest masses consistent with probable. EKG: Sinus rhythm and nonspecific ST-T wave changes  ASSESSMENT AND PLAN:  Patient with history of coronary disease however presents with fatigue and shortness of breath as well as weight loss. Chest CT reveals bilateral pulmonary emboli. Abdominal CT also revealed multiple masses in the lung, liver, pancreas. Likely metastatic disease. 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 Patient does not appear to be ischemic from a cardiac standpoint. RV strain is typical for pulmonary embolus. Trivial troponin elevation is also not unusual in a pulmonary embolus. Would continue to treat with Lovenox as you were doing. Workup of abdominal masses is appropriate. We'll follow along with you. Patient  does not appear to require invasive or noninvasive ischemic workup. Will review echocardiogram regarding LV and RV function when available Signed: Teodoro Spray MD, [facility] 07/03/2015, 3:50 PM

## 2015-07-03 NOTE — H&P (Signed)
[facility] [place] NAME: Steven Lewis    MR#:  [id number]  DATE OF BIRTH:  January 13, 1941  DATE OF ADMISSION:  07/03/2015  PRIMARY CARE PHYSICIAN: Golden Pop, MD   REQUESTING/REFERRING PHYSICIAN: Dr. Reita Cliche  CHIEF COMPLAINT:   Chief Complaint  Patient presents with  . Diarrhea    HISTORY OF PRESENT ILLNESS:  Steven Lewis  is a 74 y.o. male with a known history of coronary artery disease, diabetes, hypertension, hyperlipidemia, chronic diastolic dysfunction and paroxysmal atrial fibrillation who presents with nausea and diarrhea for about 3 weeks and about an 80 pound weight loss over the past year and a half. He states he has struggled with GI issues for about a year with alternating constipation and diarrhea. Symptoms have gotten worse over the past 3 weeks with constant diarrhea. No hematochezia or melanoma. He has had nausea but no vomiting. His appetite has been significantly decreased. For the past 2-3 days he's felt very weak and he has been very short of breath.   CT angiogram of the chest and CT of the abdomen were performed in the emergency room revealing bilateral submassive pulmonary emboli with evidence of right heart strain as well as what seems to be diffuse metastatic disease with masses throughout the liver, subpleural nodules, possible kidney mass and a low attenuation masslike area within the pancreatic body and tail. His last colonoscopy was in 2013. He has a strong family history of cancer in his mother father and sister.  PAST MEDICAL HISTORY:   Past Medical History  Diagnosis Date  . Hyperlipidemia   . Diabetes mellitus   . Thyroid disease     hypothyroidism  . Atrial flutter   . Coronary artery disease     stent placed x 2  . Cancer   . CHF (congestive heart failure)   . Hypertension   . Hypothyroidism     PAST SURGICAL HISTORY:   Past Surgical History  Procedure Laterality Date  . Splenectomy,  total    . Cardiac catheterization      stent placed x2    SOCIAL HISTORY:   History  Substance Use Topics  . Smoking status: Former Smoker -- 1.00 packs/day for 25 years    Types: Cigarettes    Quit date: 12/06/1981  . Smokeless tobacco: Not on file  . Alcohol Use: No    FAMILY HISTORY:   Family History  Problem Relation Age of Onset  . Heart disease Mother   . Breast cancer Mother   . Cancer Father   . Colon cancer Sister     DRUG ALLERGIES:   Allergies  Allergen Reactions  . Penicillins     REVIEW OF SYSTEMS:   Review of Systems  Constitutional: Positive for weight loss, malaise/fatigue and diaphoresis. Negative for fever and chills.  HENT: Negative for congestion and hearing loss.   Eyes: Negative for blurred vision and pain.  Respiratory: Positive for shortness of breath. Negative for cough, hemoptysis, sputum production, wheezing and stridor.   Cardiovascular: Negative for chest pain, palpitations, orthopnea and leg swelling.  Gastrointestinal: Positive for nausea and diarrhea. Negative for heartburn, vomiting, abdominal pain, constipation, blood in stool and melena.  Genitourinary: Negative for dysuria, urgency and frequency.  Musculoskeletal: Negative for myalgias, back pain, joint pain and neck pain.  Skin: Negative for rash.  Neurological: Positive for weakness. Negative for focal weakness, loss of consciousness and headaches.  Endo/Heme/Allergies: Does not bruise/bleed easily.  Psychiatric/Behavioral: Negative  for depression and hallucinations. The patient is not nervous/anxious.     MEDICATIONS AT HOME:   Prior to Admission medications   Medication Sig Start Date End Date Taking? Authorizing Provider  aspirin EC 81 MG tablet Take 81 mg by mouth daily.   Yes Historical Provider, MD  diltiazem (CARDIZEM CD) 120 MG 24 hr capsule Take 120 mg by mouth daily.     Yes Historical Provider, MD  fexofenadine (ALLEGRA) 180 MG tablet Take 180 mg by mouth daily.      Yes Historical Provider, MD  fluticasone (FLONASE) 50 MCG/ACT nasal spray Place 2 sprays into both nostrils daily. 06/23/15  Yes Guadalupe Maple, MD  levothyroxine (SYNTHROID, LEVOTHROID) 75 MCG tablet Take 75 mcg by mouth daily.     Yes Historical Provider, MD  metFORMIN (GLUCOPHAGE) 500 MG tablet Take 500 mg by mouth daily with breakfast.     Yes Historical Provider, MD  omega-3 acid ethyl esters (LOVAZA) 1 G capsule Take 2 g by mouth daily.     Yes Historical Provider, MD      VITAL SIGNS:  Blood pressure 119/70, pulse 99, temperature 97.6 F (36.4 C), temperature source Oral, resp. rate 18, height 6\' 1"  (1.854 m), weight 74.39 kg (164 lb), SpO2 95 %.  PHYSICAL EXAMINATION:  GENERAL:  74 y.o.-year-old patient lying in the bed with no acute distress.  EYES: Pupils equal, round, reactive to light and accommodation. No scleral icterus. Extraocular muscles intact.  HEENT: Head atraumatic, normocephalic. Oropharynx and nasopharynx clear. Mucous membranes intact, upper and lower dentures in place  NECK:  Supple, no jugular venous distention. No thyroid enlargement, no tenderness.  LUNGS: Normal breath sounds bilaterally, no wheezing, rales, rhonchi or crepitation. No use of accessory muscles of respiration.  CARDIOVASCULAR: S1, S2 normal. No murmurs, rubs, or gallops.  ABDOMEN: Soft, nontender, distended. Bowel sounds present. No organomegaly or mass. No guarding no rebound. Large midline abdominal scar  EXTREMITIES: No pedal edema, cyanosis, or clubbing. Peripheral pulses 2+  NEUROLOGIC: Cranial nerves II through XII are intact. Muscle strength 5/5 in all extremities. Sensation intact. Gait not checked.  PSYCHIATRIC: The patient is alert and oriented x 3. calm SKIN: No obvious rash, lesion, or ulcer.   LABORATORY PANEL:   CBC  Recent Labs Lab 07/03/15 0939  WBC 10.3  HGB 12.3*  HCT 37.9*  PLT 309    ------------------------------------------------------------------------------------------------------------------  Chemistries   Recent Labs Lab 07/03/15 0939  NA 136  K 4.9  CL 99*  CO2 27  GLUCOSE 175*  BUN 30*  CREATININE 0.99  CALCIUM 8.9  AST 48*  ALT 38  ALKPHOS 325*  BILITOT 0.9   ------------------------------------------------------------------------------------------------------------------  Cardiac Enzymes  Recent Labs Lab 07/03/15 0939  TROPONINI 0.04*   ------------------------------------------------------------------------------------------------------------------  RADIOLOGY:  Ct Angio Chest Pe W/cm &/or Wo Cm  07/03/2015   CLINICAL DATA:  Patient with shortness of breath which has worsened over the past week. Diffuse abdominal pain. 80 pound weight loss. Prior lymphoma and splenectomy.  EXAM: CT ANGIOGRAPHY CHEST  CT ABDOMEN AND PELVIS WITH CONTRAST  TECHNIQUE: Multidetector CT imaging of the chest was performed using the standard protocol during bolus administration of intravenous contrast. Multiplanar CT image reconstructions and MIPs were obtained to evaluate the vascular anatomy. Multidetector CT imaging of the abdomen and pelvis was performed using the standard protocol during bolus administration of intravenous contrast.  CONTRAST:  181mL OMNIPAQUE IOHEXOL 350 MG/ML SOLN  COMPARISON:  None.  FINDINGS: CTA CHEST FINDINGS  Mediastinum/Nodes: The  visualized thyroid is unremarkable. The heart is enlarged. Trace pericardial fluid. The ascending thoracic aorta is dilated measuring 4.1 cm. The main pulmonary artery is dilated measuring 4.1 cm. The esophagus is patulous and contains oral contrast and fluid. 10 mm right gastroesophageal lymph node (image 83; series 5). Calcified left supraclavicular lymph node measuring 10 mm.  Adequate opacification of the main pulmonary artery for the detection of pulmonary embolism. Acute embolism is demonstrated within the right  lower, right middle and right upper lobe segmental pulmonary arteries. Small amount of peripheral emboli demonstrated within the left lower lobe pulmonary arteries. The RV/LV ratio is 1.2 .  Lungs/Pleura: Central airways are patent. There is a moderate left pleural effusion. Consolidative opacities are demonstrated within the left lower lobe adjacent to the pleural effusion. Bandlike retracted consolidative opacities are demonstrated within the paramediastinal lungs bilaterally with associated bronchiectasis. This may represent sequelae of radiation therapy. Additionally within the peripheral right upper lobe there is a subpleural 2.4 x 1.1 cm nodule. Within the right middle lobe there is a 0.6 cm subpleural nodule. No pneumothorax.  Musculoskeletal: No aggressive or acute appearing osseous lesions. Multiple healed posterior left lateral lower rib fractures.  CT ABDOMEN and PELVIS FINDINGS  Hepatobiliary: There are innumerable low-attenuation lesions throughout the liver most compatible with metastatic disease. The largest of these lesions involves the left hepatic lobe and measures 9.2 x 9.1 cm. Reference low-attenuation lesion within the right hepatic lobe measures 2.9 x 2.4 cm (image 28; series 4). Gallbladder is decompressed.  Pancreas: Calcification within the pancreatic head. There is a 5.6 x 2.7 cm low-attenuation area involving the pancreatic body and tail (image 20; series 4) which is favored to represent dilated pancreatic duct.  Spleen: Surgically absent.  Adrenals/Urinary Tract: The adrenal glands are normal. Kidneys enhance symmetrically with contrast. Within the superior pole of the left kidney there is a 2.0 cm low-attenuation mass, indeterminate.  Stomach/Bowel: The colon is relatively decompressed. The appearance of the cecum is favored to be secondary to underdistention. No evidence for bowel obstruction. No free intraperitoneal air.  Vascular/Lymphatic: Infrarenal abdominal aortic ectasia  measuring 2.5 cm. Peripheral calcified atherosclerotic plaque. Multiple prominent and enlarged porta hepatic lymph nodes measuring up to 1.6 cm.  Other: There is a moderate to large amount of ascites throughout all 4 quadrants of the abdomen. Somewhat nodular appearance to the greater omentum. Central dystrophic calcifications in the prostate.  Musculoskeletal: No aggressive or acute appearing osseous lesions. Lower lumbar spine degenerative changes.  Review of the MIP images confirms the above findings.  IMPRESSION: Findings compatible with acute pulmonary embolism involving the right and left lungs particularly the segmental pulmonary arteries. Positive for acute PE with CT evidence of right heart strain (RV/LV Ratio = 1.2) consistent with at least submassive (intermediate risk) PE. The presence of right heart strain has been associated with an increased risk of morbidity and mortality. Please activate Code PE by paging [phone].  Low-attenuation masslike area within the pancreatic body and tail which may represent ductal dilatation in the setting of obstructing pancreatic mass which is concerning for adenocarcinoma. This may represent the primary malignancy. Recommend further evaluation with ERCP.  Multiple low-attenuation masses throughout the liver with the largest in the left hepatic lobe measuring up to 9.2 cm, most compatible with metastatic disease.  Moderate left pleural effusion. Consolidative opacities within the left lower lobe potentially secondary to atelectasis. Infection not excluded.  Subpleural nodules within the right upper and right middle lobes are nonspecific and may  be secondary to metastatic disease, pulmonary infarct or postsurgical change in the appropriate clinical setting.  Bilateral paramediastinal consolidation with associated bronchiectasis, favored to represent post radiation change with the appropriate clinical history.  Large volume ascites. Somewhat nodular appearance of the  omentum which is nonspecific and may be secondary to the large volume ascites. Peritoneal carcinomatosis is not excluded.  Indeterminate low-attenuation lesion within the superior pole of the left kidney potentially representing a complicated cyst. Recommend attention on followup.  Ascending thoracic aorta measures 4.1 cm. Recommend followup by abdomen and pelvis CTA in 6 months, and vascular surgery referral/consultation if not already obtained. This recommendation follows ACR consensus guidelines: White Paper of the ACR Incidental Findings Committee II on Vascular Findings. J Am Coll Radiol 2013; 10:789-794.  Dilation of the main pulmonary artery compatible with pulmonary arterial hypertension.  Fluid throughout the esophagus as can be seen with reflux.  Critical Value/emergent results were called by telephone at the time of interpretation on 07/03/2015 at 12:54 pm to Dr. Lisa Roca , who verbally acknowledged these results.   Electronically Signed   By: Lovey Newcomer M.D.   On: 07/03/2015 13:13   Ct Abdomen Pelvis W Contrast  07/03/2015   CLINICAL DATA:  Patient with shortness of breath which has worsened over the past week. Diffuse abdominal pain. 80 pound weight loss. Prior lymphoma and splenectomy.  EXAM: CT ANGIOGRAPHY CHEST  CT ABDOMEN AND PELVIS WITH CONTRAST  TECHNIQUE: Multidetector CT imaging of the chest was performed using the standard protocol during bolus administration of intravenous contrast. Multiplanar CT image reconstructions and MIPs were obtained to evaluate the vascular anatomy. Multidetector CT imaging of the abdomen and pelvis was performed using the standard protocol during bolus administration of intravenous contrast.  CONTRAST:  174mL OMNIPAQUE IOHEXOL 350 MG/ML SOLN  COMPARISON:  None.  FINDINGS: CTA CHEST FINDINGS  Mediastinum/Nodes: The visualized thyroid is unremarkable. The heart is enlarged. Trace pericardial fluid. The ascending thoracic aorta is dilated measuring 4.1 cm. The main  pulmonary artery is dilated measuring 4.1 cm. The esophagus is patulous and contains oral contrast and fluid. 10 mm right gastroesophageal lymph node (image 83; series 5). Calcified left supraclavicular lymph node measuring 10 mm.  Adequate opacification of the main pulmonary artery for the detection of pulmonary embolism. Acute embolism is demonstrated within the right lower, right middle and right upper lobe segmental pulmonary arteries. Small amount of peripheral emboli demonstrated within the left lower lobe pulmonary arteries. The RV/LV ratio is 1.2 .  Lungs/Pleura: Central airways are patent. There is a moderate left pleural effusion. Consolidative opacities are demonstrated within the left lower lobe adjacent to the pleural effusion. Bandlike retracted consolidative opacities are demonstrated within the paramediastinal lungs bilaterally with associated bronchiectasis. This may represent sequelae of radiation therapy. Additionally within the peripheral right upper lobe there is a subpleural 2.4 x 1.1 cm nodule. Within the right middle lobe there is a 0.6 cm subpleural nodule. No pneumothorax.  Musculoskeletal: No aggressive or acute appearing osseous lesions. Multiple healed posterior left lateral lower rib fractures.  CT ABDOMEN and PELVIS FINDINGS  Hepatobiliary: There are innumerable low-attenuation lesions throughout the liver most compatible with metastatic disease. The largest of these lesions involves the left hepatic lobe and measures 9.2 x 9.1 cm. Reference low-attenuation lesion within the right hepatic lobe measures 2.9 x 2.4 cm (image 28; series 4). Gallbladder is decompressed.  Pancreas: Calcification within the pancreatic head. There is a 5.6 x 2.7 cm low-attenuation area involving the pancreatic  body and tail (image 20; series 4) which is favored to represent dilated pancreatic duct.  Spleen: Surgically absent.  Adrenals/Urinary Tract: The adrenal glands are normal. Kidneys enhance symmetrically  with contrast. Within the superior pole of the left kidney there is a 2.0 cm low-attenuation mass, indeterminate.  Stomach/Bowel: The colon is relatively decompressed. The appearance of the cecum is favored to be secondary to underdistention. No evidence for bowel obstruction. No free intraperitoneal air.  Vascular/Lymphatic: Infrarenal abdominal aortic ectasia measuring 2.5 cm. Peripheral calcified atherosclerotic plaque. Multiple prominent and enlarged porta hepatic lymph nodes measuring up to 1.6 cm.  Other: There is a moderate to large amount of ascites throughout all 4 quadrants of the abdomen. Somewhat nodular appearance to the greater omentum. Central dystrophic calcifications in the prostate.  Musculoskeletal: No aggressive or acute appearing osseous lesions. Lower lumbar spine degenerative changes.  Review of the MIP images confirms the above findings.  IMPRESSION: Findings compatible with acute pulmonary embolism involving the right and left lungs particularly the segmental pulmonary arteries. Positive for acute PE with CT evidence of right heart strain (RV/LV Ratio = 1.2) consistent with at least submassive (intermediate risk) PE. The presence of right heart strain has been associated with an increased risk of morbidity and mortality. Please activate Code PE by paging [phone].  Low-attenuation masslike area within the pancreatic body and tail which may represent ductal dilatation in the setting of obstructing pancreatic mass which is concerning for adenocarcinoma. This may represent the primary malignancy. Recommend further evaluation with ERCP.  Multiple low-attenuation masses throughout the liver with the largest in the left hepatic lobe measuring up to 9.2 cm, most compatible with metastatic disease.  Moderate left pleural effusion. Consolidative opacities within the left lower lobe potentially secondary to atelectasis. Infection not excluded.  Subpleural nodules within the right upper and right  middle lobes are nonspecific and may be secondary to metastatic disease, pulmonary infarct or postsurgical change in the appropriate clinical setting.  Bilateral paramediastinal consolidation with associated bronchiectasis, favored to represent post radiation change with the appropriate clinical history.  Large volume ascites. Somewhat nodular appearance of the omentum which is nonspecific and may be secondary to the large volume ascites. Peritoneal carcinomatosis is not excluded.  Indeterminate low-attenuation lesion within the superior pole of the left kidney potentially representing a complicated cyst. Recommend attention on followup.  Ascending thoracic aorta measures 4.1 cm. Recommend followup by abdomen and pelvis CTA in 6 months, and vascular surgery referral/consultation if not already obtained. This recommendation follows ACR consensus guidelines: White Paper of the ACR Incidental Findings Committee II on Vascular Findings. J Am Coll Radiol 2013; 10:789-794.  Dilation of the main pulmonary artery compatible with pulmonary arterial hypertension.  Fluid throughout the esophagus as can be seen with reflux.  Critical Value/emergent results were called by telephone at the time of interpretation on 07/03/2015 at 12:54 pm to Dr. Lisa Roca , who verbally acknowledged these results.   Electronically Signed   By: Lovey Newcomer M.D.   On: 07/03/2015 13:13    EKG:   Orders placed or performed during the hospital encounter of 07/03/15  . ED EKG   (only if pt is 75 y.o. or older and pain is above umbilicus)  . ED EKG   (only if pt is 75 y.o. or older and pain is above umbilicus)    IMPRESSION AND PLAN:   Principal Problem:   Pulmonary embolism Active Problems:   Coronary artery disease   Abdominal mass  Diastolic dysfunction   Paroxysmal atrial fibrillation   Diabetes mellitus type 2 in nonobese   Hypertension  Problem #1 bilateral pulmonary emboli, submassive with signs of right hernia strain on CT  angiogram: Heparin drip has been ordered, will discontinue this and start Lovenox. Will admit to telemetry. We will obtain a 2-D echocardiogram. Will consult oncology for further recommendations as this is a new PE in patient with malignancy, will likely need continued Lovenox therapy. We'll consult cardiology as there is signs of right heart strain and he has a history of coronary artery disease, pulmonary hypertension and diastolic dysfunction.  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 Will obtain gastroenterology consultation. He sees Dr. Vira Agar as an outpatient. This is not urgent and could be scheduled for next week or even as an outpatient. Consult oncology for further evaluation and treatment plan.  #3 coronary artery disease: No chest pain at this time. No significant EKG changes.   #4 paroxysmal atrial fibrillation: Currently in normal sinus rhythm. Not on anticoagulation at home. Continue Cardizem  #5 diabetes mellitus type 2: We'll check a hemoglobin A1c as he has lost a significant amount of weight and may no longer need metformin. We'll start sliding scale insulin sensitive.  #6 hypertension: Not hypertensive at this time. Monitor closely in the setting of new PE with potential right hearted strain  #7 hypothyroidism: Check TSH and continue Synthroid  Prophylaxis: Patient on therapeutic Lovenox. We will also start a PPI    All the records are reviewed and case discussed with ED provider. Management plans discussed with the patient, family and they are in agreement.  CODE STATUS: Full code. This was discussed with the patient and his partner Sharbel Sahagun on admission. He does not have a living will or formal power of attorney. He would like his son Wilber Fini to be his decision maker in the case of emergency.   TOTAL TIME TAKING CARE OF THIS PATIENT: 55 minutes. Greater than 50% of time  in care coordination and counseling.   Myrtis Ser M.D on 07/03/2015 at 2:10 PM  Between 7am to 6pm - Pager - [phone]  After 6pm go to [email or web address] - password [email or web address] [facility]  Office  [phone]  CC: Primary care physician; Golden Pop, MD

## 2015-07-03 NOTE — ED Notes (Signed)
MD at bedside. 

## 2015-07-03 NOTE — Progress Notes (Signed)
ANTICOAGULATION CONSULT NOTE - Initial Consult  Pharmacy Consult for Lovenox Indication: pulmonary embolus  Allergies  Allergen Reactions  . Penicillins     Patient Measurements: Height: 6\' 1"  (185.4 cm) Weight: 164 lb (74.39 kg) IBW/kg (Calculated) : 79.9 Heparin Dosing Weight:  74.4kg  Vital Signs: Temp: 97.6 F (36.4 C) (07/08 0937) Temp Source: Oral (07/08 0937) BP: 126/73 mmHg (07/08 1400) Pulse Rate: 101 (07/08 1400)  Labs:  Recent Labs  07/03/15 0939  HGB 12.3*  HCT 37.9*  PLT 309  APTT 29  LABPROT 13.4  INR 1.00  CREATININE 0.99  TROPONINI 0.04*    Estimated Creatinine Clearance: 67.8 mL/min (by C-G formula based on Cr of 0.99).   Medical History: Past Medical History  Diagnosis Date  . Hyperlipidemia   . Diabetes mellitus   . Thyroid disease     hypothyroidism  . Atrial flutter   . Coronary artery disease     stent placed x 2  . Cancer   . CHF (congestive heart failure)   . Hypertension   . Hypothyroidism     Medications:   (Not in a hospital admission)  Assessment: Pt with bilateral PE. TBW = 74.4kg  Goal of Therapy:  resolution of PE     Plan:  Lovenox 75 mg SQ Q12H ordered to start 7/8 @ 15:00.   Jonaya Freshour D 07/03/2015,2:37 PM

## 2015-07-03 NOTE — Progress Notes (Signed)
*  PRELIMINARY RESULTS* Echocardiogram 2D Echocardiogram has been performed.  Steven Lewis 07/03/2015, 6:29 PM

## 2015-07-03 NOTE — Progress Notes (Addendum)
Pt. admitted to unit rm 245; wife at bedside. Oriented to room, call bell, Ascom phones and staff. Bed in low position. Fall safety plan reviewed, blue non skid socks in place and fall contract signed and hung on wall. Full assessment to Epic. Will continue to monitor.

## 2015-07-03 NOTE — Consult Note (Signed)
GI Inpatient Consult Note  Reason for Consult: New abdominal masses   Attending Requesting Consult: Dr. Volanda Napoleon  History of Present Illness: Steven Lewis is a 74 y.o. male with a history of CHF, CAD, DM, Aflutter, and diabetes admitted for new abdominal masses seen on CT scan.  Patient presented to the ED for nausea and diarrhea x 3 weeks.  CT scan of abdomen and chest revealed diffuse metastatic disease with masses throughout the liver, subpleural nodules, possible kidney mass and a low attenuation masslike area within the pancreatic body and tail. He is a patient of Dr. Vira Lewis with last colonoscopy in 2013.  Patient states he has had recurrent GI issues for the last year, alternating between constipation and diarrhea.  Over the last 3 weeks diarrhea has predominated, along with worsening nausea, epigastric pain, and abdominal fullness.  He tried pepto-bismol with no relief.  He also began experiencing worsening SOB over the last 3-5 days.  He notes 80 lb weight loss over the last year and a half and decreased appetite over the last 3 days. Last colonoscopy was performed in 2013, with 2 polyps removed.  He denies fever, chills, vomiting, hematochezia, and melena.  Currently he is not feeling nauseous and reports only two episodes of diarrhea today.  He states he feels better today than he has the previous few days.   Past Medical History:  Past Medical History  Diagnosis Date  . Hyperlipidemia   . Diabetes mellitus   . Thyroid disease     hypothyroidism  . Atrial flutter   . Coronary artery disease     stent placed x 2  . Cancer   . CHF (congestive heart failure)   . Hypertension   . Hypothyroidism     Problem List: Patient Active Problem List   Diagnosis Date Noted  . Pulmonary embolism 07/03/2015  . Abdominal mass 07/03/2015  . Diastolic dysfunction 33/35/4562  . Paroxysmal atrial fibrillation 07/03/2015  . Diabetes mellitus type 2 in nonobese 07/03/2015  . Hypertension  07/03/2015  . Atrial flutter   . Coronary artery disease     Past Surgical History: Past Surgical History  Procedure Laterality Date  . Splenectomy, total    . Cardiac catheterization      stent placed x2    Allergies: Allergies  Allergen Reactions  . Penicillins     Home Medications: Prescriptions prior to admission  Medication Sig Dispense Refill Last Dose  . aspirin EC 81 MG tablet Take 81 mg by mouth daily.   07/03/2015 at Unknown time  . diltiazem (CARDIZEM CD) 120 MG 24 hr capsule Take 120 mg by mouth daily.     07/03/2015 at Unknown time  . fexofenadine (ALLEGRA) 180 MG tablet Take 180 mg by mouth daily.     07/03/2015 at Unknown time  . fluticasone (FLONASE) 50 MCG/ACT nasal spray Place 2 sprays into both nostrils daily. 16 g 12 07/03/2015 at Unknown time  . levothyroxine (SYNTHROID, LEVOTHROID) 75 MCG tablet Take 75 mcg by mouth daily.     07/03/2015 at Unknown time  . metFORMIN (GLUCOPHAGE) 500 MG tablet Take 500 mg by mouth daily with breakfast.     07/03/2015 at Unknown time  . omega-3 acid ethyl esters (LOVAZA) 1 G capsule Take 2 g by mouth daily.     07/03/2015 at Unknown time   Home medication reconciliation was completed with the patient.   Scheduled Inpatient Medications:   . aspirin EC  81 mg Oral Daily  .  diltiazem  120 mg Oral Daily  . enoxaparin (LOVENOX) injection  1 mg/kg Subcutaneous Q12H  . fluticasone  2 spray Each Nare Daily  . insulin aspart  0-5 Units Subcutaneous QHS  . insulin aspart  0-9 Units Subcutaneous TID WC  . levothyroxine  75 mcg Oral Daily  . loratadine  10 mg Oral Daily  . omega-3 acid ethyl esters  2 g Oral Daily  . pantoprazole  40 mg Oral Daily  . sodium chloride  3 mL Intravenous Q12H    Continuous Inpatient Infusions:     PRN Inpatient Medications:  acetaminophen **OR** acetaminophen, alum & mag hydroxide-simeth, magnesium hydroxide  Family History: family history includes Breast cancer in his mother; Cancer in his father; Colon  cancer in his sister; Heart disease in his mother.   Social History:   reports that he quit smoking about 33 years ago. His smoking use included Cigarettes. He has a 25 pack-year smoking history. He does not have any smokeless tobacco history on file. He reports that he does not drink alcohol or use illicit drugs.  Review of Systems: Constitutional: + 80 lb weight loss, fatigue, weakness, night sweats. Eyes: No changes in vision. ENT: No oral lesions, sore throat.  GI: see HPI.  Heme/Lymph: No easy bleeding or bruising.  CV: No chest pain.  GU: No hematuria.  Integumentary: No rashes.  Neuro: No headaches.  Psych: No depression/anxiety.  Endocrine: No heat/cold intolerance.  Allergic/Immunologic: No urticaria.  Resp: No cough, + SOB.  Musculoskeletal: No joint swelling.    Physical Examination: BP 131/70 mmHg  Pulse 106  Temp(Src) 98.3 F (36.8 C) (Oral)  Resp 18  Ht 6\' 1"  (1.854 m)  Wt 73.12 kg (161 lb 3.2 oz)  BMI 21.27 kg/m2  SpO2 95% Gen: NAD, alert and oriented x 4, thin and chronically ill-appearing HEENT: PEERLA, EOMI, Neck: supple, no JVD or thyromegaly Chest: CTA bilaterally, no wheezes, crackles, or other adventitious sounds CV: RRR, no m/g/c/r Abd: soft, NT, mildly distended, +BS in all four quadrants; no HSM, guarding, ridigity, or rebound tenderness Ext: no edema, well perfused with 2+ pulses, Skin: no rash or lesions noted Lymph: no LAD  Data: Lab Results  Component Value Date   WBC 10.3 07/03/2015   HGB 12.3* 07/03/2015   HCT 37.9* 07/03/2015   MCV 93.6 07/03/2015   PLT 309 07/03/2015    Recent Labs Lab 07/03/15 0939  HGB 12.3*   Lab Results  Component Value Date   NA 136 07/03/2015   K 4.9 07/03/2015   CL 99* 07/03/2015   CO2 27 07/03/2015   BUN 30* 07/03/2015   CREATININE 0.99 07/03/2015   Lab Results  Component Value Date   ALT 38 07/03/2015   AST 48* 07/03/2015   ALKPHOS 325* 07/03/2015   BILITOT 0.9 07/03/2015    Recent  Labs Lab 07/03/15 0939  APTT 29  INR 1.00   Assessment/Plan: Mr. Steven Lewis is a 74 y.o. male with a history of CHF, CAD, DM, Aflutter, and diabetes admitted for multiple abdominal masses detected on CT scan.  Patient has a number of concerning issues including bilateral PE, lung, liver, and pancreas masses.  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  Lovenox was started for PEs, so the timing of additional work-up of abd masses will need to be coordinated when it is safe for him to come of anticoagulation.  Recommending CT-guided liver biopsy to confirm diagnosis once safe from ani-coag and PE standpoint.  Oncology consulted  for further advice on liver and pancreatic masses.  Recommendations: - CT-guided liver biopsy for definitive diagnosis of cancer. Given his massive PE, will need to wait until safe to stop anti-coagulation long enough to peforom biopsy (this decision should be made by  heme-onc regaridng timing of stopping anti-coag for biopsy).  - Trial of pancreatic enzymes if diarrhea remains symptomatic - No indication for ERCP currently given no evidence of obstructive jaundice.   Thank you for the consult. Please call with questions or concerns.  Berdia Lachman, Grace Blight, MD

## 2015-07-03 NOTE — Consult Note (Signed)
[name]  [phone]  [phone]     Steven Lewis DOB: August 25, 1941  MR#: [id number]  HYQ#:[id number]  Patient Care Team: Guadalupe Maple, MD as PCP - General (Unknown Physician Specialty)  CHIEF COMPLAINT:  Chief Complaint  Patient presents with  . Diarrhea    INTERVAL HISTORY:  Patient is a 74 year old male with a known history of CAD, DPM, HTN, hyperlipidemia, chronic diastolic dysfunction and paroxysmal atrial fibrillation who presented to the ER with nausea and diarrhea for approximately 3 weeks. Patient also reports an 80 pound weight loss over the past year and a half. He was just admitted through the ER today with weakness, shortness of breath, decreased appetite, diarrhea. Recently had a CT scan of chest as well as and abdomen and pelvis scan. CT scan of chest compatible with acute pulmonary embolism involving the right and left lungs particularly the segmental pulmonary arteries, suggestive of right heart strain consistent with at least a massive PE. CT of abdomen and pelvis consistent with a masslike area within the pancreatic body and tail which is concerning for adenocarcinoma. He is also noted to have multiple low attenuating masses throughout the liver with the largest in the left hepatic lobe measuring up to 9.2 cm, most likely metastatic disease. He also has several subpleural nodules within the right upper and right middle lobes that are nonspecific, a moderate left pleural effusion.  REVIEW OF SYSTEMS:   Review of Systems  Constitutional: Positive for weight loss. Negative for fever, chills, malaise/fatigue and diaphoresis.  HENT: Negative for congestion, ear discharge, ear pain, hearing loss, nosebleeds, sore throat and tinnitus.   Eyes: Negative for blurred vision, double vision, photophobia, pain, discharge and redness.  Respiratory: Negative for cough, hemoptysis, sputum production, shortness of breath, wheezing and stridor.     Cardiovascular: Negative for chest pain, palpitations, orthopnea, claudication, leg swelling and PND.  Gastrointestinal: Positive for nausea and diarrhea. Negative for heartburn, vomiting, abdominal pain, constipation, blood in stool and melena.  Genitourinary: Negative.   Musculoskeletal: Negative.   Skin: Negative.   Neurological: Positive for weakness. Negative for dizziness, tingling, focal weakness, seizures and headaches.  Endo/Heme/Allergies: Does not bruise/bleed easily.  Psychiatric/Behavioral: Negative for depression. The patient is not nervous/anxious and does not have insomnia.     As per HPI. Otherwise, a complete review of systems is negatve.  ONCOLOGY HISTORY:  No history exists.    PAST MEDICAL HISTORY: Past Medical History  Diagnosis Date  . Hyperlipidemia   . Diabetes mellitus   . Thyroid disease     hypothyroidism  . Atrial flutter   . Coronary artery disease     stent placed x 2  . Cancer   . CHF (congestive heart failure)   . Hypertension   . Hypothyroidism     PAST SURGICAL HISTORY: Past Surgical History  Procedure Laterality Date  . Splenectomy, total    . Cardiac catheterization      stent placed x2    FAMILY HISTORY Family History  Problem Relation Age of Onset  . Heart disease Mother   . Breast cancer Mother   . Cancer Father   . Colon cancer Sister     GYNECOLOGIC HISTORY:  No LMP for male patient.     ADVANCED DIRECTIVES:    HEALTH MAINTENANCE: History  Substance Use Topics  . Smoking status: Former Smoker -- 1.00 packs/day for 25 years    Types: Cigarettes    Quit date: 12/06/1981  . Smokeless  tobacco: Not on file  . Alcohol Use: No     Colonoscopy:  PAP:  Bone density:  Lipid panel:  Allergies  Allergen Reactions  . Penicillins     Current Facility-Administered Medications  Medication Dose Route Frequency Provider Last Rate Last Dose  . acetaminophen (TYLENOL) tablet 650 mg  650 mg Oral Q6H PRN Aldean Jewett, MD       Or  . acetaminophen (TYLENOL) suppository 650 mg  650 mg Rectal Q6H PRN Aldean Jewett, MD      . alum & mag hydroxide-simeth (MAALOX/MYLANTA) 200-200-20 MG/5ML suspension 30 mL  30 mL Oral Q6H PRN Aldean Jewett, MD      . aspirin EC tablet 81 mg  81 mg Oral Daily Aldean Jewett, MD   81 mg at 07/03/15 1552  . diltiazem (CARDIZEM CD) 24 hr capsule 120 mg  120 mg Oral Daily Aldean Jewett, MD   120 mg at 07/03/15 1552  . enoxaparin (LOVENOX) injection 75 mg  1 mg/kg Subcutaneous Q12H Aldean Jewett, MD   75 mg at 07/03/15 1707  . fluticasone (FLONASE) 50 MCG/ACT nasal spray 2 spray  2 spray Each Nare Daily Aldean Jewett, MD   2 spray at 07/03/15 1552  . insulin aspart (novoLOG) injection 0-5 Units  0-5 Units Subcutaneous QHS Aldean Jewett, MD      . insulin aspart (novoLOG) injection 0-9 Units  0-9 Units Subcutaneous TID WC Aldean Jewett, MD   0 Units at 07/03/15 1705  . levothyroxine (SYNTHROID, LEVOTHROID) tablet 75 mcg  75 mcg Oral Daily Aldean Jewett, MD   75 mcg at 07/03/15 1552  . loratadine (CLARITIN) tablet 10 mg  10 mg Oral Daily Aldean Jewett, MD   10 mg at 07/03/15 1552  . magnesium hydroxide (MILK OF MAGNESIA) suspension 30 mL  30 mL Oral Daily PRN Aldean Jewett, MD      . omega-3 acid ethyl esters (LOVAZA) capsule 2 g  2 g Oral Daily Aldean Jewett, MD   2 g at 07/03/15 1553  . pantoprazole (PROTONIX) EC tablet 40 mg  40 mg Oral Daily Aldean Jewett, MD   40 mg at 07/03/15 1553  . sodium chloride 0.9 % injection 3 mL  3 mL Intravenous Q12H Aldean Jewett, MD   3 mL at 07/03/15 1711    OBJECTIVE: BP 131/70 mmHg  Pulse 106  Temp(Src) 98.3 F (36.8 C) (Oral)  Resp 18  Ht $R'6\' 1"'Os$  (1.854 m)  Wt 161 lb 3.2 oz (73.12 kg)  BMI 21.27 kg/m2  SpO2 95%   Body mass index is 21.27 kg/(m^2).    ECOG FS:2 - Symptomatic, <50% confined to bed  General: Well-developed, well-nourished, no acute distress. Eyes: Pink conjunctiva,  anicteric sclera. HEENT: Normocephalic, moist mucous membranes, clear oropharnyx. Lungs: Clear to auscultation bilaterally. Heart: Regular rate and rhythm. No rubs, murmurs, or gallops. Abdomen: Soft, nontender, nondistended. No organomegaly noted, normoactive bowel sounds. Breast: Breast palpated in a circular manner in the sitting and supine positions.  No masses or fullness palpated.  Axilla palpated in both positions with no masses or fullness palpated.  Musculoskeletal: No edema, cyanosis, or clubbing. Neuro: Alert, answering all questions appropriately. Cranial nerves grossly intact. Skin: No rashes or petechiae noted. Psych: Normal affect. Lymphatics: No cervical, calvicular, axillary or inguinal LAD.   LAB RESULTS:  Admission on 07/03/2015  Component Date Value Ref Range Status  . WBC 07/03/2015 10.3  3.8 - 10.6 K/uL Final  . RBC 07/03/2015 4.05* 4.40 - 5.90 MIL/uL Final  . Hemoglobin 07/03/2015 12.3* 13.0 - 18.0 g/dL Final  . HCT 07/03/2015 37.9* 40.0 - 52.0 % Final  . MCV 07/03/2015 93.6  80.0 - 100.0 fL Final  . MCH 07/03/2015 30.4  26.0 - 34.0 pg Final  . MCHC 07/03/2015 32.5  32.0 - 36.0 g/dL Final  . RDW 07/03/2015 14.2  11.5 - 14.5 % Final  . Platelets 07/03/2015 309  150 - 440 K/uL Final  . Neutrophils Relative % 07/03/2015 78   Final  . Neutro Abs 07/03/2015 8.0* 1.4 - 6.5 K/uL Final  . Lymphocytes Relative 07/03/2015 10   Final  . Lymphs Abs 07/03/2015 1.0  1.0 - 3.6 K/uL Final  . Monocytes Relative 07/03/2015 11   Final  . Monocytes Absolute 07/03/2015 1.2* 0.2 - 1.0 K/uL Final  . Eosinophils Relative 07/03/2015 0   Final  . Eosinophils Absolute 07/03/2015 0.0  0 - 0.7 K/uL Final  . Basophils Relative 07/03/2015 1   Final  . Basophils Absolute 07/03/2015 0.1  0 - 0.1 K/uL Final  . Sodium 07/03/2015 136  135 - 145 mmol/L Final  . Potassium 07/03/2015 4.9  3.5 - 5.1 mmol/L Final  . Chloride 07/03/2015 99* 101 - 111 mmol/L Final  . CO2 07/03/2015 27  22 - 32  mmol/L Final  . Glucose, Bld 07/03/2015 175* 65 - 99 mg/dL Final  . BUN 07/03/2015 30* 6 - 20 mg/dL Final  . Creatinine, Ser 07/03/2015 0.99  0.61 - 1.24 mg/dL Final  . Calcium 07/03/2015 8.9  8.9 - 10.3 mg/dL Final  . Total Protein 07/03/2015 8.0  6.5 - 8.1 g/dL Final  . Albumin 07/03/2015 3.6  3.5 - 5.0 g/dL Final  . AST 07/03/2015 48* 15 - 41 U/L Final  . ALT 07/03/2015 38  17 - 63 U/L Final  . Alkaline Phosphatase 07/03/2015 325* 38 - 126 U/L Final  . Total Bilirubin 07/03/2015 0.9  0.3 - 1.2 mg/dL Final  . GFR calc non Af Amer 07/03/2015 >60  >60 mL/min Final  . GFR calc Af Amer 07/03/2015 >60  >60 mL/min Final   Comment: (NOTE) The eGFR has been calculated using the CKD EPI equation. This calculation has not been validated in all clinical situations. eGFR's persistently <60 mL/min signify possible Chronic Kidney Disease.   . Anion gap 07/03/2015 10  5 - 15 Final  . Troponin I 07/03/2015 0.04* <0.031 ng/mL Final   Comment: READ BACK AND VERIFIED NELLIE MONOR 1023 07/03/15 BOD        PERSISTENTLY INCREASED TROPONIN VALUES IN THE RANGE OF 0.04-0.49 ng/mL CAN BE SEEN IN:       -UNSTABLE ANGINA       -CONGESTIVE HEART FAILURE       -MYOCARDITIS       -CHEST TRAUMA       -ARRYHTHMIAS       -LATE PRESENTING MYOCARDIAL INFARCTION       -COPD   CLINICAL FOLLOW-UP RECOMMENDED.   Marland Kitchen aPTT 07/03/2015 29  24 - 36 seconds Final  . Prothrombin Time 07/03/2015 13.4  11.4 - 15.0 seconds Final  . INR 07/03/2015 1.00   Final  . TSH 07/03/2015 4.245  0.350 - 4.500 uIU/mL Final  . Glucose-Capillary 07/03/2015 118* 65 - 99 mg/dL Final    STUDIES: Ct Angio Chest Pe W/cm &/or Wo Cm  07/03/2015   CLINICAL DATA:  Patient with shortness of breath which  has worsened over the past week. Diffuse abdominal pain. 80 pound weight loss. Prior lymphoma and splenectomy.  EXAM: CT ANGIOGRAPHY CHEST  CT ABDOMEN AND PELVIS WITH CONTRAST  TECHNIQUE: Multidetector CT imaging of the chest was performed using the  standard protocol during bolus administration of intravenous contrast. Multiplanar CT image reconstructions and MIPs were obtained to evaluate the vascular anatomy. Multidetector CT imaging of the abdomen and pelvis was performed using the standard protocol during bolus administration of intravenous contrast.  CONTRAST:  112m OMNIPAQUE IOHEXOL 350 MG/ML SOLN  COMPARISON:  None.  FINDINGS: CTA CHEST FINDINGS  Mediastinum/Nodes: The visualized thyroid is unremarkable. The heart is enlarged. Trace pericardial fluid. The ascending thoracic aorta is dilated measuring 4.1 cm. The main pulmonary artery is dilated measuring 4.1 cm. The esophagus is patulous and contains oral contrast and fluid. 10 mm right gastroesophageal lymph node (image 83; series 5). Calcified left supraclavicular lymph node measuring 10 mm.  Adequate opacification of the main pulmonary artery for the detection of pulmonary embolism. Acute embolism is demonstrated within the right lower, right middle and right upper lobe segmental pulmonary arteries. Small amount of peripheral emboli demonstrated within the left lower lobe pulmonary arteries. The RV/LV ratio is 1.2 .  Lungs/Pleura: Central airways are patent. There is a moderate left pleural effusion. Consolidative opacities are demonstrated within the left lower lobe adjacent to the pleural effusion. Bandlike retracted consolidative opacities are demonstrated within the paramediastinal lungs bilaterally with associated bronchiectasis. This may represent sequelae of radiation therapy. Additionally within the peripheral right upper lobe there is a subpleural 2.4 x 1.1 cm nodule. Within the right middle lobe there is a 0.6 cm subpleural nodule. No pneumothorax.  Musculoskeletal: No aggressive or acute appearing osseous lesions. Multiple healed posterior left lateral lower rib fractures.  CT ABDOMEN and PELVIS FINDINGS  Hepatobiliary: There are innumerable low-attenuation lesions throughout the liver  most compatible with metastatic disease. The largest of these lesions involves the left hepatic lobe and measures 9.2 x 9.1 cm. Reference low-attenuation lesion within the right hepatic lobe measures 2.9 x 2.4 cm (image 28; series 4). Gallbladder is decompressed.  Pancreas: Calcification within the pancreatic head. There is a 5.6 x 2.7 cm low-attenuation area involving the pancreatic body and tail (image 20; series 4) which is favored to represent dilated pancreatic duct.  Spleen: Surgically absent.  Adrenals/Urinary Tract: The adrenal glands are normal. Kidneys enhance symmetrically with contrast. Within the superior pole of the left kidney there is a 2.0 cm low-attenuation mass, indeterminate.  Stomach/Bowel: The colon is relatively decompressed. The appearance of the cecum is favored to be secondary to underdistention. No evidence for bowel obstruction. No free intraperitoneal air.  Vascular/Lymphatic: Infrarenal abdominal aortic ectasia measuring 2.5 cm. Peripheral calcified atherosclerotic plaque. Multiple prominent and enlarged porta hepatic lymph nodes measuring up to 1.6 cm.  Other: There is a moderate to large amount of ascites throughout all 4 quadrants of the abdomen. Somewhat nodular appearance to the greater omentum. Central dystrophic calcifications in the prostate.  Musculoskeletal: No aggressive or acute appearing osseous lesions. Lower lumbar spine degenerative changes.  Review of the MIP images confirms the above findings.  IMPRESSION: Findings compatible with acute pulmonary embolism involving the right and left lungs particularly the segmental pulmonary arteries. Positive for acute PE with CT evidence of right heart strain (RV/LV Ratio = 1.2) consistent with at least submassive (intermediate risk) PE. The presence of right heart strain has been associated with an increased risk of morbidity and mortality. Please  activate Code PE by paging [phone].  Low-attenuation masslike area within the  pancreatic body and tail which may represent ductal dilatation in the setting of obstructing pancreatic mass which is concerning for adenocarcinoma. This may represent the primary malignancy. Recommend further evaluation with ERCP.  Multiple low-attenuation masses throughout the liver with the largest in the left hepatic lobe measuring up to 9.2 cm, most compatible with metastatic disease.  Moderate left pleural effusion. Consolidative opacities within the left lower lobe potentially secondary to atelectasis. Infection not excluded.  Subpleural nodules within the right upper and right middle lobes are nonspecific and may be secondary to metastatic disease, pulmonary infarct or postsurgical change in the appropriate clinical setting.  Bilateral paramediastinal consolidation with associated bronchiectasis, favored to represent post radiation change with the appropriate clinical history.  Large volume ascites. Somewhat nodular appearance of the omentum which is nonspecific and may be secondary to the large volume ascites. Peritoneal carcinomatosis is not excluded.  Indeterminate low-attenuation lesion within the superior pole of the left kidney potentially representing a complicated cyst. Recommend attention on followup.  Ascending thoracic aorta measures 4.1 cm. Recommend followup by abdomen and pelvis CTA in 6 months, and vascular surgery referral/consultation if not already obtained. This recommendation follows ACR consensus guidelines: White Paper of the ACR Incidental Findings Committee II on Vascular Findings. J Am Coll Radiol 2013; 10:789-794.  Dilation of the main pulmonary artery compatible with pulmonary arterial hypertension.  Fluid throughout the esophagus as can be seen with reflux.  Critical Value/emergent results were called by telephone at the time of interpretation on 07/03/2015 at 12:54 pm to Dr. Lisa Roca , who verbally acknowledged these results.   Electronically Signed   By: Lovey Newcomer M.D.   On:  07/03/2015 13:13   Ct Abdomen Pelvis W Contrast  07/03/2015   CLINICAL DATA:  Patient with shortness of breath which has worsened over the past week. Diffuse abdominal pain. 80 pound weight loss. Prior lymphoma and splenectomy.  EXAM: CT ANGIOGRAPHY CHEST  CT ABDOMEN AND PELVIS WITH CONTRAST  TECHNIQUE: Multidetector CT imaging of the chest was performed using the standard protocol during bolus administration of intravenous contrast. Multiplanar CT image reconstructions and MIPs were obtained to evaluate the vascular anatomy. Multidetector CT imaging of the abdomen and pelvis was performed using the standard protocol during bolus administration of intravenous contrast.  CONTRAST:  142mL OMNIPAQUE IOHEXOL 350 MG/ML SOLN  COMPARISON:  None.  FINDINGS: CTA CHEST FINDINGS  Mediastinum/Nodes: The visualized thyroid is unremarkable. The heart is enlarged. Trace pericardial fluid. The ascending thoracic aorta is dilated measuring 4.1 cm. The main pulmonary artery is dilated measuring 4.1 cm. The esophagus is patulous and contains oral contrast and fluid. 10 mm right gastroesophageal lymph node (image 83; series 5). Calcified left supraclavicular lymph node measuring 10 mm.  Adequate opacification of the main pulmonary artery for the detection of pulmonary embolism. Acute embolism is demonstrated within the right lower, right middle and right upper lobe segmental pulmonary arteries. Small amount of peripheral emboli demonstrated within the left lower lobe pulmonary arteries. The RV/LV ratio is 1.2 .  Lungs/Pleura: Central airways are patent. There is a moderate left pleural effusion. Consolidative opacities are demonstrated within the left lower lobe adjacent to the pleural effusion. Bandlike retracted consolidative opacities are demonstrated within the paramediastinal lungs bilaterally with associated bronchiectasis. This may represent sequelae of radiation therapy. Additionally within the peripheral right upper lobe  there is a subpleural 2.4 x 1.1 cm nodule. Within the right  middle lobe there is a 0.6 cm subpleural nodule. No pneumothorax.  Musculoskeletal: No aggressive or acute appearing osseous lesions. Multiple healed posterior left lateral lower rib fractures.  CT ABDOMEN and PELVIS FINDINGS  Hepatobiliary: There are innumerable low-attenuation lesions throughout the liver most compatible with metastatic disease. The largest of these lesions involves the left hepatic lobe and measures 9.2 x 9.1 cm. Reference low-attenuation lesion within the right hepatic lobe measures 2.9 x 2.4 cm (image 28; series 4). Gallbladder is decompressed.  Pancreas: Calcification within the pancreatic head. There is a 5.6 x 2.7 cm low-attenuation area involving the pancreatic body and tail (image 20; series 4) which is favored to represent dilated pancreatic duct.  Spleen: Surgically absent.  Adrenals/Urinary Tract: The adrenal glands are normal. Kidneys enhance symmetrically with contrast. Within the superior pole of the left kidney there is a 2.0 cm low-attenuation mass, indeterminate.  Stomach/Bowel: The colon is relatively decompressed. The appearance of the cecum is favored to be secondary to underdistention. No evidence for bowel obstruction. No free intraperitoneal air.  Vascular/Lymphatic: Infrarenal abdominal aortic ectasia measuring 2.5 cm. Peripheral calcified atherosclerotic plaque. Multiple prominent and enlarged porta hepatic lymph nodes measuring up to 1.6 cm.  Other: There is a moderate to large amount of ascites throughout all 4 quadrants of the abdomen. Somewhat nodular appearance to the greater omentum. Central dystrophic calcifications in the prostate.  Musculoskeletal: No aggressive or acute appearing osseous lesions. Lower lumbar spine degenerative changes.  Review of the MIP images confirms the above findings.  IMPRESSION: Findings compatible with acute pulmonary embolism involving the right and left lungs particularly the  segmental pulmonary arteries. Positive for acute PE with CT evidence of right heart strain (RV/LV Ratio = 1.2) consistent with at least submassive (intermediate risk) PE. The presence of right heart strain has been associated with an increased risk of morbidity and mortality. Please activate Code PE by paging [phone].  Low-attenuation masslike area within the pancreatic body and tail which may represent ductal dilatation in the setting of obstructing pancreatic mass which is concerning for adenocarcinoma. This may represent the primary malignancy. Recommend further evaluation with ERCP.  Multiple low-attenuation masses throughout the liver with the largest in the left hepatic lobe measuring up to 9.2 cm, most compatible with metastatic disease.  Moderate left pleural effusion. Consolidative opacities within the left lower lobe potentially secondary to atelectasis. Infection not excluded.  Subpleural nodules within the right upper and right middle lobes are nonspecific and may be secondary to metastatic disease, pulmonary infarct or postsurgical change in the appropriate clinical setting.  Bilateral paramediastinal consolidation with associated bronchiectasis, favored to represent post radiation change with the appropriate clinical history.  Large volume ascites. Somewhat nodular appearance of the omentum which is nonspecific and may be secondary to the large volume ascites. Peritoneal carcinomatosis is not excluded.  Indeterminate low-attenuation lesion within the superior pole of the left kidney potentially representing a complicated cyst. Recommend attention on followup.  Ascending thoracic aorta measures 4.1 cm. Recommend followup by abdomen and pelvis CTA in 6 months, and vascular surgery referral/consultation if not already obtained. This recommendation follows ACR consensus guidelines: White Paper of the ACR Incidental Findings Committee II on Vascular Findings. J Am Coll Radiol 2013; 10:789-794.   Dilation of the main pulmonary artery compatible with pulmonary arterial hypertension.  Fluid throughout the esophagus as can be seen with reflux.  Critical Value/emergent results were called by telephone at the time of interpretation on 07/03/2015 at 12:54 pm to Dr. Wells Guiles  LORD , who verbally acknowledged these results.   Electronically Signed   By: Lovey Newcomer M.D.   On: 07/03/2015 13:13    ASSESSMENT:  Pancreatic mass. Bilateral PEs with right heart strain.  PLAN:  1. Bilateral PEs with right heart strain. After lengthy discussion with Dr. Mike Gip, she feels patient needs to be on anticoagulation for the next several weeks before becoming stable enough to hold anticoagulation for biopsy. He is currently on Lovenox injections 75 mg twice a day.  2. Pancreatic mass. 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 Pathology cannot be confirmed at this time due to need for anticoagulation. Dr. Mike Gip plans to present this case at case conference to determine best plan for biopsy.   Discussed at length with patient who had several questions about overall prognosis. Advised patient that prognosis was poor and that treatment would be palliative in nature. Discussed possibility of chemotherapy and he is reluctant at this time. He does have family in [place] and would like to discuss this with his 2 sons and sisters. Also discussed CODE STATUS with patient. At this time he would like to remain a full code until he is able to talk to his family. Advised patient also that we would continue to discuss the possibility of biopsy with he and his family over the weekend.  Patient expressed understanding and was in agreement with this plan. He also understands that He can call clinic at any time with any questions, concerns, or complaints.   Dr. Mike Gip was available for consultation and review of plan of care for this patient.   Evlyn Kanner, NP   07/03/2015  6:16 PM

## 2015-07-03 NOTE — Progress Notes (Signed)
ANTICOAGULATION CONSULT NOTE - Initial Consult  Pharmacy Consult for Heparin dosing  Indication: pulmonary embolus  Allergies  Allergen Reactions  . Penicillins     Patient Measurements: Height: 6\' 1"  (185.4 cm) Weight: 164 lb (74.39 kg) IBW/kg (Calculated) : 79.9 Heparin Dosing Weight: 74.4kg  Vital Signs: Temp: 97.6 F (36.4 C) (07/08 0937) Temp Source: Oral (07/08 0937) BP: 134/77 mmHg (07/08 1130) Pulse Rate: 98 (07/08 1130)  Labs:  Recent Labs  07/03/15 0939  HGB 12.3*  HCT 37.9*  PLT 309  CREATININE 0.99  TROPONINI 0.04*    Estimated Creatinine Clearance: 67.8 mL/min (by C-G formula based on Cr of 0.99).   Medical History: Past Medical History  Diagnosis Date  . Hyperlipidemia   . Diabetes mellitus   . Thyroid disease     hypothyroidism  . Atrial flutter   . Coronary artery disease     stent placed x 2  . Cancer   . CHF (congestive heart failure)     Medications:  Scheduled:   Assessment:  Goal of Therapy:  Heparin level 0.3-0.7 units/ml Monitor platelets by anticoagulation protocol: Yes   Plan:  Give 4500 units bolus x 1 Start heparin infusion at 1250 units/hr Check anti-Xa level in 8 hours and daily while on heparin Continue to monitor H&H and platelets  Taneisha Fuson D Sulo Janczak 07/03/2015,1:20 PM

## 2015-07-04 LAB — CBC
HEMATOCRIT: 33.9 % — AB (ref 40.0–52.0)
HEMOGLOBIN: 11.3 g/dL — AB (ref 13.0–18.0)
MCH: 31.4 pg (ref 26.0–34.0)
MCHC: 33.5 g/dL (ref 32.0–36.0)
MCV: 93.9 fL (ref 80.0–100.0)
Platelets: 292 10*3/uL (ref 150–440)
RBC: 3.61 MIL/uL — AB (ref 4.40–5.90)
RDW: 14.7 % — ABNORMAL HIGH (ref 11.5–14.5)
WBC: 9.4 10*3/uL (ref 3.8–10.6)

## 2015-07-04 LAB — HEMOGLOBIN A1C: Hgb A1c MFr Bld: 6.2 % — ABNORMAL HIGH (ref 4.0–6.0)

## 2015-07-04 LAB — BASIC METABOLIC PANEL
Anion gap: 7 (ref 5–15)
BUN: 23 mg/dL — AB (ref 6–20)
CALCIUM: 8.7 mg/dL — AB (ref 8.9–10.3)
CO2: 29 mmol/L (ref 22–32)
Chloride: 101 mmol/L (ref 101–111)
Creatinine, Ser: 0.77 mg/dL (ref 0.61–1.24)
GFR calc Af Amer: >60 mL/min (ref >60)
GFR calc non Af Amer: >60 mL/min (ref >60)
Glucose, Bld: 164 mg/dL — ABNORMAL HIGH (ref 65–99)
Potassium: 4.6 mmol/L (ref 3.5–5.1)
SODIUM: 137 mmol/L (ref 135–145)

## 2015-07-04 LAB — CEA: CEA: 271.7 ng/mL — ABNORMAL HIGH (ref 0.0–4.7)

## 2015-07-04 LAB — GLUCOSE, CAPILLARY: Glucose-Capillary: 134 mg/dL — ABNORMAL HIGH (ref 65–99)

## 2015-07-04 LAB — CANCER ANTIGEN 19-9: CA 19 9: 395600 U/mL — AB (ref 0–35)

## 2015-07-04 MED ORDER — ENSURE ENLIVE PO LIQD: 237.0000 mL | Freq: Three times a day (TID) | ORAL | Status: DC

## 2015-07-04 MED ORDER — APIXABAN 5 MG PO TABS: 10.0000 mg | ORAL_TABLET | Freq: Two times a day (BID) | ORAL | Status: AC

## 2015-07-04 MED ORDER — APIXABAN 5 MG PO TABS: 10.0000 mg | ORAL_TABLET | Freq: Two times a day (BID) | ORAL | Status: DC

## 2015-07-04 MED ORDER — APIXABAN 5 MG PO TABS: 5.0000 mg | ORAL_TABLET | Freq: Two times a day (BID) | ORAL | Status: AC

## 2015-07-04 MED ORDER — APIXABAN 5 MG PO TABS: 5.0000 mg | ORAL_TABLET | Freq: Two times a day (BID) | ORAL | Status: DC

## 2015-07-04 NOTE — Care Management Note (Signed)
Case Management Note  Patient Details  Name: Steven Lewis MRN: [id number] Date of Birth: 01/18/1941  Subjective/Objective:         Provided [name] with a coupon for Eliquis.            Action/Plan:   Expected Discharge Date:                  Expected Discharge Plan:     In-House Referral:     Discharge planning Services     Post Acute Care Choice:    Choice offered to:     DME Arranged:    DME Agency:     HH Arranged:    [place] Agency:     Status of Service:     Medicare Important Message Given:    Date Medicare IM Given:    Medicare IM give by:    Date Additional Medicare IM Given:    Additional Medicare Important Message give by:     If discussed at [place] of Stay Meetings, dates discussed:    Additional Comments:  Zailee Vallely A, RN 07/04/2015, 11:54 AM

## 2015-07-04 NOTE — Progress Notes (Signed)
ANTICOAGULATION CONSULT NOTE - Initial Consult  Pharmacy Consult for Eliquis  Indication: pulmonary embolus  Allergies  Allergen Reactions  . Penicillins     Patient Measurements: Height: 6\' 1"  (185.4 cm) Weight: 162 lb 1.6 oz (73.528 kg) IBW/kg (Calculated) : 79.9   Vital Signs: Temp: 98.3 F (36.8 C) (07/09 0525) Temp Source: Oral (07/09 0525) BP: 117/63 mmHg (07/09 0525) Pulse Rate: 93 (07/09 0525)  Labs:  Recent Labs  07/03/15 0939 07/03/15 2213 07/04/15 0524  HGB 12.3*  --  11.3*  HCT 37.9*  --  33.9*  PLT 309  --  292  APTT 29  --   --   LABPROT 13.4  --   --   INR 1.00  --   --   HEPARINUNFRC  --  0.58  --   CREATININE 0.99  --  0.77  TROPONINI 0.04*  --   --     Estimated Creatinine Clearance: 82.9 mL/min (by C-G formula based on Cr of 0.77).   Medical History: Past Medical History  Diagnosis Date  . Hyperlipidemia   . Diabetes mellitus   . Thyroid disease     hypothyroidism  . Atrial flutter   . Coronary artery disease     stent placed x 2  . Cancer   . CHF (congestive heart failure)   . Hypertension   . Hypothyroidism     Medications:  Scheduled:  . apixaban  10 mg Oral BID   Followed by  . [START ON 07/11/2015] apixaban  5 mg Oral BID  . aspirin EC  81 mg Oral Daily  . diltiazem  120 mg Oral Daily  . fluticasone  2 spray Each Nare Daily  . insulin aspart  0-5 Units Subcutaneous QHS  . insulin aspart  0-9 Units Subcutaneous TID WC  . levothyroxine  75 mcg Oral Daily  . loratadine  10 mg Oral Daily  . omega-3 acid ethyl esters  2 g Oral Daily  . pantoprazole  40 mg Oral Daily  . sodium chloride  3 mL Intravenous Q12H    Assessment: Patient is a 74 yo male with orders for Lovenox 75 mg subq q12h for treatment of PE.  Pharmacy consulted to transition patient to Eliquis.  SCr: 0.77, Wt: 73.5 kg, Age <80 years Hgb: 11.3  Goal of Therapy:  Monitor hemoglobin and SCr per policy   Plan:  Patient received last treatment dose of  Lovenox at 0445.  Will order Eliquis to start ~12 hours after last dose at 1700.  Continue Eliquis 10 mg po BID x 7 days then transition to 5 mg po BID.   Pharmacy will continue to follow.  Murrell Converse, PharmD Clinical Pharmacist 07/04/2015

## 2015-07-04 NOTE — Progress Notes (Signed)
Referral to Hospice for home health Hospice nursing monitoring and evaluation. Aaron Edelman Dr Molli Hazard / Braxton Feathers, RN, BSN on 07/04/15 @ 11:00am.

## 2015-07-04 NOTE — Progress Notes (Signed)
Discharge instructions along with home medication list and follow up gone over with patient and wife. Both verbalized that they understood instructions. Printed rx x2 given to patient. Iv and telemetry removed. Case manager lynn spoke with patient about eliquis rx and hospice at home. Patient to be discharged on ra. No distress noted. Will be taken off the floor via wheelchair by cna [employer]

## 2015-07-04 NOTE — Care Management Note (Signed)
Case Management Note  Patient Details  Name: Steven Lewis MRN: [id number] Date of Birth: 01/29/1941  Subjective/Objective:      Discussed hospice services with [name] and he chose [facility] as his hospice provider for RN services in his home. [name] has refused chemo and we discussed that Hospice nurses would monitor and assess his general health and assist with his pain management needs at home. Have been unable to contact his insurance company so far today to ask whether they covered hospice services. Spoke with Horris Latino at [facility] and she said that they would run his insurance on Monday 07/06/15 for coverage.               Action/Plan:   Expected Discharge Date:                  Expected Discharge Plan:     In-House Referral:     Discharge planning Services     Post Acute Care Choice:    Choice offered to:     DME Arranged:    DME Agency:     HH Arranged:    [place] Agency:     Status of Service:     Medicare Important Message Given:    Date Medicare IM Given:    Medicare IM give by:    Date Additional Medicare IM Given:    Additional Medicare Important Message give by:     If discussed at [place] of Stay Meetings, dates discussed:    Additional Comments:  Rhoda Waldvogel A, RN 07/04/2015, 11:42 AM

## 2015-07-04 NOTE — Discharge Summary (Signed)
[facility] NAME: Steven Lewis    MR#:  [id number]  DATE OF BIRTH:  May 03, 1941  DATE OF ADMISSION:  07/03/2015 ADMITTING PHYSICIAN: Aldean Jewett, MD  DATE OF DISCHARGE: 07/04/15  PRIMARY CARE PHYSICIAN: Golden Pop, MD    ADMISSION DIAGNOSIS:  Metastasis to liver [C78.7, C80.1] Pulmonary embolism [I26.99] Pancreatic mass [K86.9]  DISCHARGE DIAGNOSIS:  Principal Problem:   Pulmonary embolism Active Problems:   Coronary artery disease   Abdominal mass   Diastolic dysfunction   Paroxysmal atrial fibrillation   Diabetes mellitus type 2 in nonobese   Hypertension   Metastasis to liver   Pancreatic mass   SECONDARY DIAGNOSIS:   Past Medical History  Diagnosis Date  . Hyperlipidemia   . Diabetes mellitus   . Thyroid disease     hypothyroidism  . Atrial flutter   . Coronary artery disease     stent placed x 2  . Cancer   . CHF (congestive heart failure)   . Hypertension   . Hypothyroidism     HOSPITAL COURSE:   #1 bilateral pulmonary emboli, submassive with signs of right hernia strain on CT angiogram: On lovenox- will start on eliquis. Pt is seen by Cardiology, Oncology and GI. Currently o2 sats stable on room air.  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   Pt choose NOT to have chemo or further work ups and he want to stay peacefully at home.  I spoke to Case manager and pt about hospice option- he agreed- will have hospice visit hi[facility] on Monday at home.  #3 coronary artery disease: No chest pain at this time. No significant EKG changes.   #4 paroxysmal atrial fibrillation: Currently in normal sinus rhythm. Not on anticoagulation at home. Continue Cardizem, now on eliquis.  #5 diabetes mellitus type 2:   Cont home meds.  #6 hypertension: Not hypertensive at this time. Monitor closely in the setting of new PE with potential right hearted strain Remained stable.  #7 hypothyroidism: continue Synthroid   DISCHARGE CONDITIONS:   Stable.  CONSULTS OBTAINED:  Treatment Team:  Teodoro Spray, MD Josefine Class, MD  DRUG ALLERGIES:   Allergies  Allergen Reactions  . Penicillins     DISCHARGE MEDICATIONS:   Current Discharge Medication List    START taking these medications   Details  apixaban (ELIQUIS) 5 MG TABS tablet Take 1 tablet (5 mg total) by mouth 2 (two) times daily. Qty: 60 tablet, Refills: 0      CONTINUE these medications which have NOT CHANGED   Details  aspirin EC 81 MG tablet Take 81 mg by mouth daily.    diltiazem (CARDIZEM CD) 120 MG 24 hr capsule Take 120 mg by mouth daily.      fexofenadine (ALLEGRA) 180 MG tablet Take 180 mg by mouth daily.      fluticasone (FLONASE) 50 MCG/ACT nasal spray Place 2 sprays into both nostrils daily. Qty: 16 g, Refills: 12   Associated Diagnoses: Other allergic rhinitis    levothyroxine (SYNTHROID, LEVOTHROID) 75 MCG tablet Take 75 mcg by mouth daily.      metFORMIN (GLUCOPHAGE) 500 MG tablet Take 500 mg by mouth daily with breakfast.      omega-3 acid ethyl esters (LOVAZA) 1 G  capsule Take 2 g by mouth daily.           DISCHARGE INSTRUCTIONS:    Hospice service to follow at home. Follow with Dr. Susy Manor at cancer center , if any future needs arise, or change mind about having treatment options for cancer.  If you experience worsening of your admission symptoms, develop shortness of breath, life threatening emergency, suicidal or homicidal thoughts you must seek medical attention immediately by calling 911 or calling your MD immediately  if symptoms less severe.  You Must read complete instructions/literature along with all the possible adverse reactions/side effects for all the Medicines you take and that  have been prescribed to you. Take any new Medicines after you have completely understood and accept all the possible adverse reactions/side effects.   Please note  You were cared for by a hospitalist during your hospital stay. If you have any questions about your discharge medications or the care you received while you were in the hospital after you are discharged, you can call the unit and asked to speak with the hospitalist on call if the hospitalist that took care of you is not available. Once you are discharged, your primary care physician will handle any further medical issues. Please note that NO REFILLS for any discharge medications will be authorized once you are discharged, as it is imperative that you return to your primary care physician (or establish a relationship with a primary care physician if you do not have one) for your aftercare needs so that they can reassess your need for medications and monitor your lab values.    Today   CHIEF COMPLAINT:   Chief Complaint  Patient presents with  . Diarrhea    HISTORY OF PRESENT ILLNESS:  Steven Lewis  is a 74 y.o. male with a known history of coronary artery disease, diabetes, hypertension, hyperlipidemia, chronic diastolic dysfunction and paroxysmal atrial fibrillation who presents with nausea and diarrhea for about 3 weeks and about an 80 pound weight loss over the past year and a half. He states he has struggled with GI issues for about a year with alternating constipation and diarrhea. Symptoms have gotten worse over the past 3 weeks with constant diarrhea. No hematochezia or melanoma. He has had nausea but no vomiting. His appetite has been significantly decreased. For the past 2-3 days he's felt very weak and he has been very short of breath.   CT angiogram of the chest and CT of the abdomen were performed in the emergency room revealing bilateral submassive pulmonary emboli with evidence of right heart strain as well as what  seems to be diffuse metastatic disease with masses throughout the liver, subpleural nodules, possible kidney mass and a low attenuation masslike area within the pancreatic body and tail. His last colonoscopy was in 2013. He has a strong family history of cancer in his mother father and sister.   VITAL SIGNS:  Blood pressure 117/63, pulse 93, temperature 98.3 F (36.8 C), temperature source Oral, resp. rate 22, height 6\' 1"  (1.854 m), weight 73.528 kg (162 lb 1.6 oz), SpO2 94 %.  I/O:   Intake/Output Summary (Last 24 hours) at 07/04/15 1014 Last data filed at 07/04/15 0745  Gross per 24 hour  Intake    480 ml  Output    500 ml  Net    -20 ml    PHYSICAL EXAMINATION:   GENERAL: 74 y.o.-year-old patient lying in the bed with no acute distress.  EYES: Pupils equal, round,  reactive to light and accommodation. No scleral icterus. Extraocular muscles intact.  HEENT: Head atraumatic, normocephalic. Oropharynx and nasopharynx clear. Mucous membranes intact, upper and lower dentures in place  NECK: Supple, no jugular venous distention. No thyroid enlargement, no tenderness.  LUNGS: Normal breath sounds bilaterally, no wheezing, rales, rhonchi or crepitation. No use of accessory muscles of respiration.  CARDIOVASCULAR: S1, S2 normal. No murmurs, rubs, or gallops.  ABDOMEN: Soft, nontender, distended. Bowel sounds present. No organomegaly or mass. No guarding no rebound. Large midline abdominal scar  EXTREMITIES: No pedal edema, cyanosis, or clubbing. Peripheral pulses 2+  NEUROLOGIC: Cranial nerves II through XII are intact. Muscle strength 5/5 in all extremities. Sensation intact. Gait not checked.  PSYCHIATRIC: The patient is alert and oriented x 3. calm SKIN: No obvious rash, lesion, or ulcer.   DATA REVIEW:   CBC  Recent Labs Lab 07/04/15 0524  WBC 9.4  HGB 11.3*  HCT 33.9*  PLT 292    Chemistries   Recent Labs Lab 07/03/15 0939 07/04/15 0524  NA 136 137  K 4.9  4.6  CL 99* 101  CO2 27 29  GLUCOSE 175* 164*  BUN 30* 23*  CREATININE 0.99 0.77  CALCIUM 8.9 8.7*  AST 48*  --   ALT 38  --   ALKPHOS 325*  --   BILITOT 0.9  --     Cardiac Enzymes  Recent Labs Lab 07/03/15 0939  TROPONINI 0.04*    Microbiology Results  No results found for this or any previous visit.  RADIOLOGY:  Ct Angio Chest Pe W/cm &/or Wo Cm  07/03/2015   CLINICAL DATA:  Patient with shortness of breath which has worsened over the past week. Diffuse abdominal pain. 80 pound weight loss. Prior lymphoma and splenectomy.  EXAM: CT ANGIOGRAPHY CHEST  CT ABDOMEN AND PELVIS WITH CONTRAST  TECHNIQUE: Multidetector CT imaging of the chest was performed using the standard protocol during bolus administration of intravenous contrast. Multiplanar CT image reconstructions and MIPs were obtained to evaluate the vascular anatomy. Multidetector CT imaging of the abdomen and pelvis was performed using the standard protocol during bolus administration of intravenous contrast.  CONTRAST:  156mL OMNIPAQUE IOHEXOL 350 MG/ML SOLN  COMPARISON:  None.  FINDINGS: CTA CHEST FINDINGS  Mediastinum/Nodes: The visualized thyroid is unremarkable. The heart is enlarged. Trace pericardial fluid. The ascending thoracic aorta is dilated measuring 4.1 cm. The main pulmonary artery is dilated measuring 4.1 cm. The esophagus is patulous and contains oral contrast and fluid. 10 mm right gastroesophageal lymph node (image 83; series 5). Calcified left supraclavicular lymph node measuring 10 mm.  Adequate opacification of the main pulmonary artery for the detection of pulmonary embolism. Acute embolism is demonstrated within the right lower, right middle and right upper lobe segmental pulmonary arteries. Small amount of peripheral emboli demonstrated within the left lower lobe pulmonary arteries. The RV/LV ratio is 1.2 .  Lungs/Pleura: Central airways are patent. There is a moderate left pleural effusion. Consolidative  opacities are demonstrated within the left lower lobe adjacent to the pleural effusion. Bandlike retracted consolidative opacities are demonstrated within the paramediastinal lungs bilaterally with associated bronchiectasis. This may represent sequelae of radiation therapy. Additionally within the peripheral right upper lobe there is a subpleural 2.4 x 1.1 cm nodule. Within the right middle lobe there is a 0.6 cm subpleural nodule. No pneumothorax.  Musculoskeletal: No aggressive or acute appearing osseous lesions. Multiple healed posterior left lateral lower rib fractures.  CT ABDOMEN and PELVIS FINDINGS  Hepatobiliary: There are innumerable low-attenuation lesions throughout the liver most compatible with metastatic disease. The largest of these lesions involves the left hepatic lobe and measures 9.2 x 9.1 cm. Reference low-attenuation lesion within the right hepatic lobe measures 2.9 x 2.4 cm (image 28; series 4). Gallbladder is decompressed.  Pancreas: Calcification within the pancreatic head. There is a 5.6 x 2.7 cm low-attenuation area involving the pancreatic body and tail (image 20; series 4) which is favored to represent dilated pancreatic duct.  Spleen: Surgically absent.  Adrenals/Urinary Tract: The adrenal glands are normal. Kidneys enhance symmetrically with contrast. Within the superior pole of the left kidney there is a 2.0 cm low-attenuation mass, indeterminate.  Stomach/Bowel: The colon is relatively decompressed. The appearance of the cecum is favored to be secondary to underdistention. No evidence for bowel obstruction. No free intraperitoneal air.  Vascular/Lymphatic: Infrarenal abdominal aortic ectasia measuring 2.5 cm. Peripheral calcified atherosclerotic plaque. Multiple prominent and enlarged porta hepatic lymph nodes measuring up to 1.6 cm.  Other: There is a moderate to large amount of ascites throughout all 4 quadrants of the abdomen. Somewhat nodular appearance to the greater omentum.  Central dystrophic calcifications in the prostate.  Musculoskeletal: No aggressive or acute appearing osseous lesions. Lower lumbar spine degenerative changes.  Review of the MIP images confirms the above findings.  IMPRESSION: Findings compatible with acute pulmonary embolism involving the right and left lungs particularly the segmental pulmonary arteries. Positive for acute PE with CT evidence of right heart strain (RV/LV Ratio = 1.2) consistent with at least submassive (intermediate risk) PE. The presence of right heart strain has been associated with an increased risk of morbidity and mortality. Please activate Code PE by paging [phone].  Low-attenuation masslike area within the pancreatic body and tail which may represent ductal dilatation in the setting of obstructing pancreatic mass which is concerning for adenocarcinoma. This may represent the primary malignancy. Recommend further evaluation with ERCP.  Multiple low-attenuation masses throughout the liver with the largest in the left hepatic lobe measuring up to 9.2 cm, most compatible with metastatic disease.  Moderate left pleural effusion. Consolidative opacities within the left lower lobe potentially secondary to atelectasis. Infection not excluded.  Subpleural nodules within the right upper and right middle lobes are nonspecific and may be secondary to metastatic disease, pulmonary infarct or postsurgical change in the appropriate clinical setting.  Bilateral paramediastinal consolidation with associated bronchiectasis, favored to represent post radiation change with the appropriate clinical history.  Large volume ascites. Somewhat nodular appearance of the omentum which is nonspecific and may be secondary to the large volume ascites. Peritoneal carcinomatosis is not excluded.  Indeterminate low-attenuation lesion within the superior pole of the left kidney potentially representing a complicated cyst. Recommend attention on followup.  Ascending  thoracic aorta measures 4.1 cm. Recommend followup by abdomen and pelvis CTA in 6 months, and vascular surgery referral/consultation if not already obtained. This recommendation follows ACR consensus guidelines: White Paper of the ACR Incidental Findings Committee II on Vascular Findings. J Am Coll Radiol 2013; 10:789-794.  Dilation of the main pulmonary artery compatible with pulmonary arterial hypertension.  Fluid throughout the esophagus as can be seen with reflux.  Critical Value/emergent results were called by telephone at the time of interpretation on 07/03/2015 at 12:54 pm to Dr. Lisa Roca , who verbally acknowledged these results.   Electronically Signed   By: Lovey Newcomer M.D.   On: 07/03/2015 13:13   Ct Abdomen Pelvis W Contrast  07/03/2015   CLINICAL  DATA:  Patient with shortness of breath which has worsened over the past week. Diffuse abdominal pain. 80 pound weight loss. Prior lymphoma and splenectomy.  EXAM: CT ANGIOGRAPHY CHEST  CT ABDOMEN AND PELVIS WITH CONTRAST  TECHNIQUE: Multidetector CT imaging of the chest was performed using the standard protocol during bolus administration of intravenous contrast. Multiplanar CT image reconstructions and MIPs were obtained to evaluate the vascular anatomy. Multidetector CT imaging of the abdomen and pelvis was performed using the standard protocol during bolus administration of intravenous contrast.  CONTRAST:  123mL OMNIPAQUE IOHEXOL 350 MG/ML SOLN  COMPARISON:  None.  FINDINGS: CTA CHEST FINDINGS  Mediastinum/Nodes: The visualized thyroid is unremarkable. The heart is enlarged. Trace pericardial fluid. The ascending thoracic aorta is dilated measuring 4.1 cm. The main pulmonary artery is dilated measuring 4.1 cm. The esophagus is patulous and contains oral contrast and fluid. 10 mm right gastroesophageal lymph node (image 83; series 5). Calcified left supraclavicular lymph node measuring 10 mm.  Adequate opacification of the main pulmonary artery for the  detection of pulmonary embolism. Acute embolism is demonstrated within the right lower, right middle and right upper lobe segmental pulmonary arteries. Small amount of peripheral emboli demonstrated within the left lower lobe pulmonary arteries. The RV/LV ratio is 1.2 .  Lungs/Pleura: Central airways are patent. There is a moderate left pleural effusion. Consolidative opacities are demonstrated within the left lower lobe adjacent to the pleural effusion. Bandlike retracted consolidative opacities are demonstrated within the paramediastinal lungs bilaterally with associated bronchiectasis. This may represent sequelae of radiation therapy. Additionally within the peripheral right upper lobe there is a subpleural 2.4 x 1.1 cm nodule. Within the right middle lobe there is a 0.6 cm subpleural nodule. No pneumothorax.  Musculoskeletal: No aggressive or acute appearing osseous lesions. Multiple healed posterior left lateral lower rib fractures.  CT ABDOMEN and PELVIS FINDINGS  Hepatobiliary: There are innumerable low-attenuation lesions throughout the liver most compatible with metastatic disease. The largest of these lesions involves the left hepatic lobe and measures 9.2 x 9.1 cm. Reference low-attenuation lesion within the right hepatic lobe measures 2.9 x 2.4 cm (image 28; series 4). Gallbladder is decompressed.  Pancreas: Calcification within the pancreatic head. There is a 5.6 x 2.7 cm low-attenuation area involving the pancreatic body and tail (image 20; series 4) which is favored to represent dilated pancreatic duct.  Spleen: Surgically absent.  Adrenals/Urinary Tract: The adrenal glands are normal. Kidneys enhance symmetrically with contrast. Within the superior pole of the left kidney there is a 2.0 cm low-attenuation mass, indeterminate.  Stomach/Bowel: The colon is relatively decompressed. The appearance of the cecum is favored to be secondary to underdistention. No evidence for bowel obstruction. No free  intraperitoneal air.  Vascular/Lymphatic: Infrarenal abdominal aortic ectasia measuring 2.5 cm. Peripheral calcified atherosclerotic plaque. Multiple prominent and enlarged porta hepatic lymph nodes measuring up to 1.6 cm.  Other: There is a moderate to large amount of ascites throughout all 4 quadrants of the abdomen. Somewhat nodular appearance to the greater omentum. Central dystrophic calcifications in the prostate.  Musculoskeletal: No aggressive or acute appearing osseous lesions. Lower lumbar spine degenerative changes.  Review of the MIP images confirms the above findings.  IMPRESSION: Findings compatible with acute pulmonary embolism involving the right and left lungs particularly the segmental pulmonary arteries. Positive for acute PE with CT evidence of right heart strain (RV/LV Ratio = 1.2) consistent with at least submassive (intermediate risk) PE. The presence of right heart strain has been associated with  an increased risk of morbidity and mortality. Please activate Code PE by paging [phone].  Low-attenuation masslike area within the pancreatic body and tail which may represent ductal dilatation in the setting of obstructing pancreatic mass which is concerning for adenocarcinoma. This may represent the primary malignancy. Recommend further evaluation with ERCP.  Multiple low-attenuation masses throughout the liver with the largest in the left hepatic lobe measuring up to 9.2 cm, most compatible with metastatic disease.  Moderate left pleural effusion. Consolidative opacities within the left lower lobe potentially secondary to atelectasis. Infection not excluded.  Subpleural nodules within the right upper and right middle lobes are nonspecific and may be secondary to metastatic disease, pulmonary infarct or postsurgical change in the appropriate clinical setting.  Bilateral paramediastinal consolidation with associated bronchiectasis, favored to represent post radiation change with the appropriate  clinical history.  Large volume ascites. Somewhat nodular appearance of the omentum which is nonspecific and may be secondary to the large volume ascites. Peritoneal carcinomatosis is not excluded.  Indeterminate low-attenuation lesion within the superior pole of the left kidney potentially representing a complicated cyst. Recommend attention on followup.  Ascending thoracic aorta measures 4.1 cm. Recommend followup by abdomen and pelvis CTA in 6 months, and vascular surgery referral/consultation if not already obtained. This recommendation follows ACR consensus guidelines: White Paper of the ACR Incidental Findings Committee II on Vascular Findings. J Am Coll Radiol 2013; 10:789-794.  Dilation of the main pulmonary artery compatible with pulmonary arterial hypertension.  Fluid throughout the esophagus as can be seen with reflux.  Critical Value/emergent results were called by telephone at the time of interpretation on 07/03/2015 at 12:54 pm to Dr. Lisa Roca , who verbally acknowledged these results.   Electronically Signed   By: Lovey Newcomer M.D.   On: 07/03/2015 13:13     Management plans discussed with the patient, family and they are in agreement.  CODE STATUS:     Code Status Orders        Start     Ordered   07/03/15 1456  Full code   Continuous     07/03/15 1455      TOTAL TIME TAKING CARE OF THIS PATIENT:  40 minutes.    Vaughan Basta M.D on 07/04/2015 at 10:14 AM  Between 7am to 6pm - Pager - [phone]  After 6pm go to [email or web address] - password [email or web address]  Office  [phone]  CC: Primary care physician; Golden Pop, MD

## 2015-07-06 ENCOUNTER — Telehealth: Payer: Self-pay | Admitting: Family Medicine

## 2015-07-06 NOTE — Telephone Encounter (Signed)
Neoma Laming from [facility] called and stated pt discharged from [facility] on Saturday the 9th. [facility] recommended Hospice, wife wants them to come to the home todat. Hospice needs either a verbal order or they have also faxed a written order that can be signed and faxed back. Hospice needs this done ASAP today. Thanks.

## 2015-07-08 ENCOUNTER — Telehealth: Payer: Self-pay | Admitting: Family Medicine

## 2015-07-08 MED ORDER — PANTOPRAZOLE SODIUM 40 MG PO TBEC: 40.0000 mg | DELAYED_RELEASE_TABLET | Freq: Every day | ORAL | Status: AC

## 2015-07-08 NOTE — Telephone Encounter (Signed)
Steven Lewis from [facility] called stated the pt is experiencing acid reflux, they would like to know if Protonix can called in. Pharm is [facility]. Please call ASAP. Thanks.

## 2015-07-10 ENCOUNTER — Telehealth: Payer: Self-pay

## 2015-07-10 NOTE — Telephone Encounter (Signed)
Requesting verbal for a UA Ok given per Dr. Wynetta Emery

## 2015-07-11 ENCOUNTER — Telehealth: Payer: Self-pay | Admitting: Family Medicine

## 2015-07-11 MED ORDER — PROMETHAZINE HCL 25 MG PO TABS: 25.0000 mg | ORAL_TABLET | Freq: Three times a day (TID) | ORAL | Status: AC | PRN

## 2015-07-11 MED ORDER — TRAMADOL HCL 50 MG PO TABS: 50.0000 mg | ORAL_TABLET | Freq: Four times a day (QID) | ORAL | Status: AC | PRN

## 2015-07-11 NOTE — Telephone Encounter (Signed)
After hours call from Helene Kelp, nurse with Hospice Patient has stage 4 liver cancer; vomited this morning, pain is 4 out of 10 I told her that he needs to STOP metformin They do not have any signed orders for pain medicine and cannot do anything but what can be phoned in to pharmacy The best we can do, then, is tramadol for pain, see new Rx Phenergan for nausea (EKG reviewed, has hx of atrial flutter, avoiding Zofran) Call back if insufficient control of symptoms Primary can sign Hospice orders on Monday for other pain medicine and PRN options

## 2015-07-13 ENCOUNTER — Telehealth: Payer: Self-pay

## 2015-07-13 MED ORDER — MORPHINE SULFATE (CONCENTRATE) 20 MG/ML PO SOLN: 20.0000 mg | ORAL | Status: AC | PRN

## 2015-07-13 MED ORDER — LORAZEPAM 0.5 MG PO TABS
0.5000 mg | ORAL_TABLET | Freq: Three times a day (TID) | ORAL | Status: AC
Start: 2015-07-13 — End: ?

## 2015-07-13 NOTE — Telephone Encounter (Signed)
Patient is having SOB, was given O2 this morning Hospice requesting Rxs for Ativan .5mg  Q4H PRN Liquid Morphine 20mg /mL 0.25-62mL Q2H PRN for SOB  Please send to [facility]

## 2015-07-14 ENCOUNTER — Telehealth: Payer: Self-pay

## 2015-07-14 NOTE — Telephone Encounter (Signed)
[name] called to advise that Steven Lewis has requested a CT or X-Ray due to increased shortness of breath.  Patient states that he wants to be sure that he does not have additional blood clots.  Please call Otila Kluver @ [phone] to advise.

## 2015-07-14 NOTE — Telephone Encounter (Signed)
MAC addressed with nurse, no action necessary

## 2015-07-27 DEATH — deceased

## 2015-08-10 ENCOUNTER — Telehealth: Payer: Self-pay | Admitting: Family Medicine

## 2015-08-10 NOTE — Telephone Encounter (Signed)
HOSPICE CALLED AND STATED THAT THEY NEEDED TO HAVE THE FOLLOWING FORMS SENT BACK OVER TO THEM: THE PHYSICIANS ORDER REPORT                       PLAN OF TREATMENT                        HOSPICE CERTIFICATION THE DATES WERE FROM July 11 TO October 8

## 2017-02-19 IMAGING — CT CT ABD-PELV W/ CM
1 of 5 series · 12 of 30 positions shown, 15 images · IV contrast (APPLIED)
Comparison: None.

CLINICAL DATA: Patient with shortness of breath which has worsened
over the past week. Diffuse abdominal pain. 80 pound weight loss.
Prior lymphoma and splenectomy.

EXAM:
CT ANGIOGRAPHY CHEST
CT ABDOMEN AND PELVIS WITH CONTRAST
TECHNIQUE: Multidetector CT imaging of the chest was performed using the
standard protocol during bolus administration of intravenous
contrast. Multiplanar CT image reconstructions and MIPs were
obtained to evaluate the vascular anatomy. Multidetector CT imaging
of the abdomen and pelvis was performed using the standard protocol
during bolus administration of intravenous contrast.
CONTRAST:  100mL OMNIPAQUE IOHEXOL 350 MG/ML SOLN

[Series 7: pe 1.0 thins · axial · 0.70mm/px · z∈[-224,+21]mm · 12 of 291 slices shown, 15 images]
[im 23/291  mediastinal]
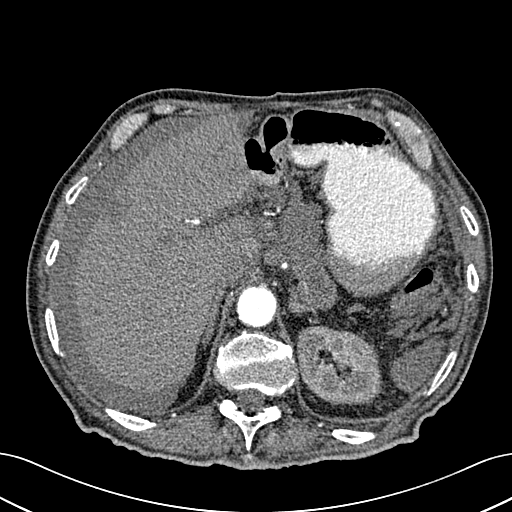
[im 23/291  lung]
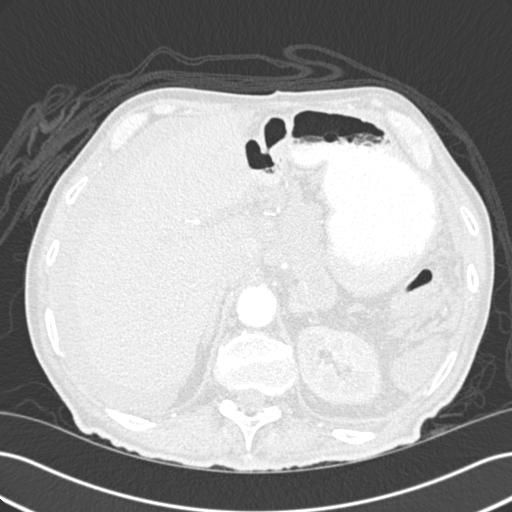
[im 45/291  lung]
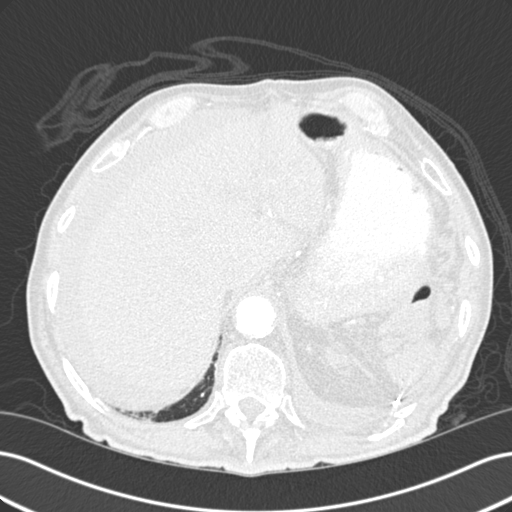
[im 67/291  lung]
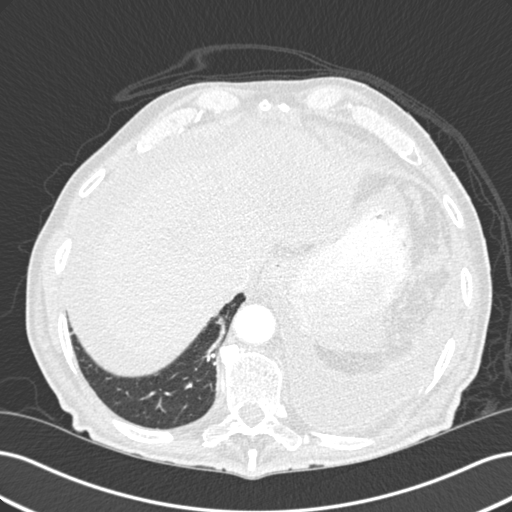
[im 90/291  lung]
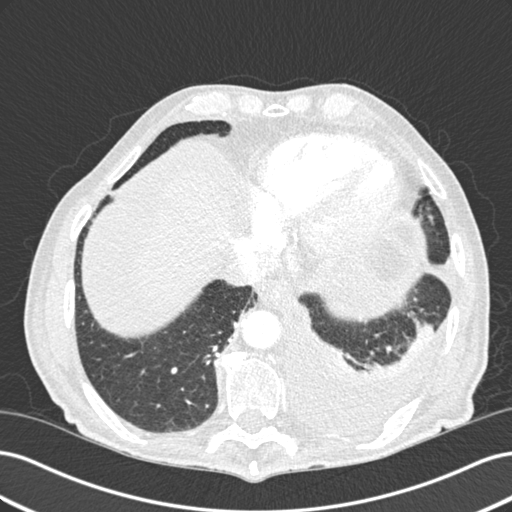
[im 112/291  mediastinal]
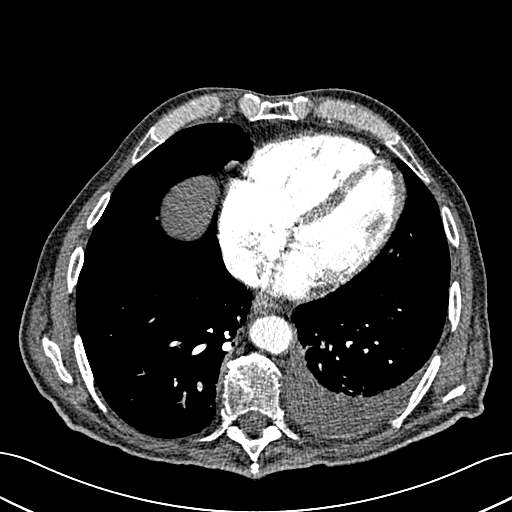
[im 112/291  lung]
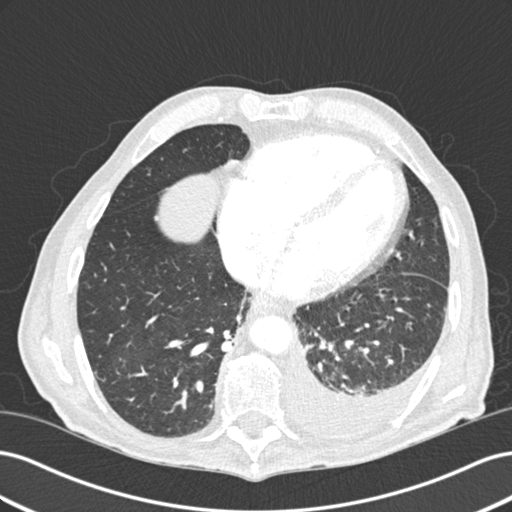
[im 134/291  lung]
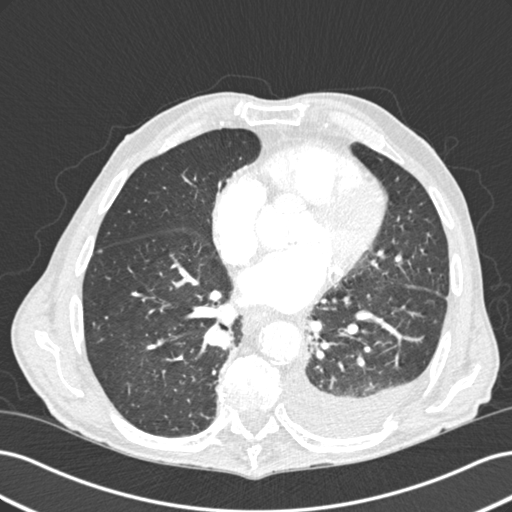
[im 157/291  lung]
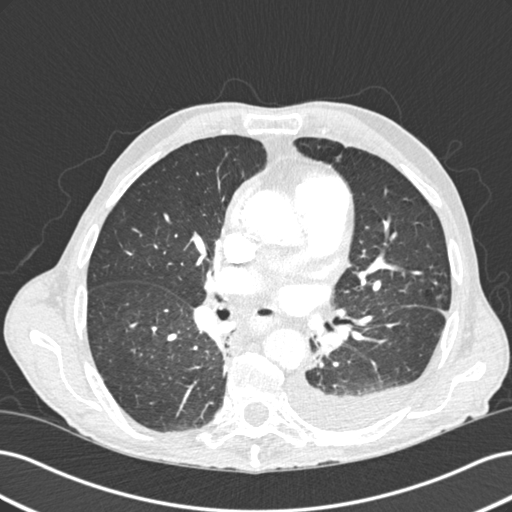
[im 179/291  lung]
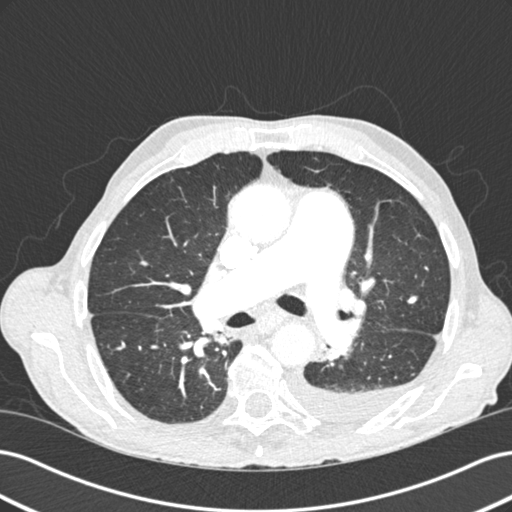
[im 201/291  mediastinal]
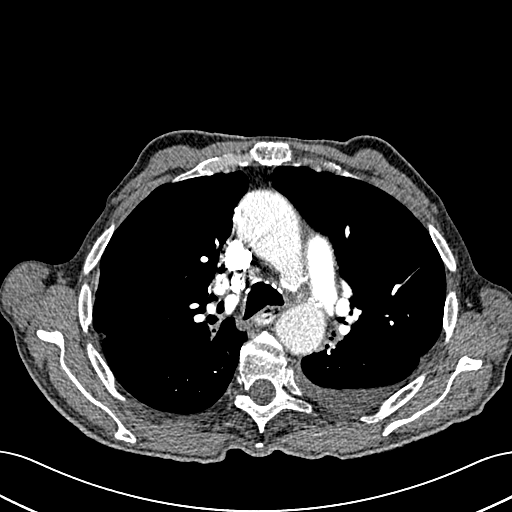
[im 201/291  lung]
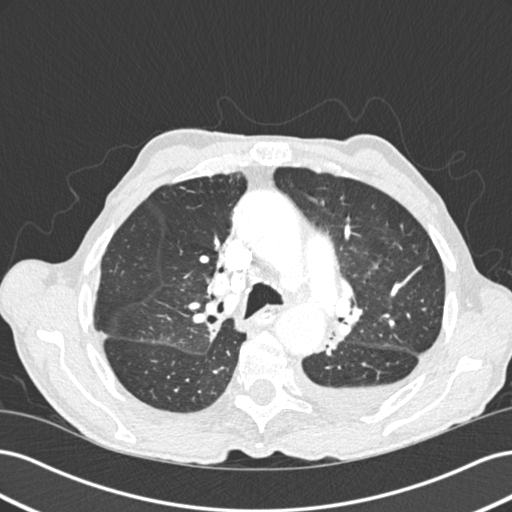
[im 224/291  lung]
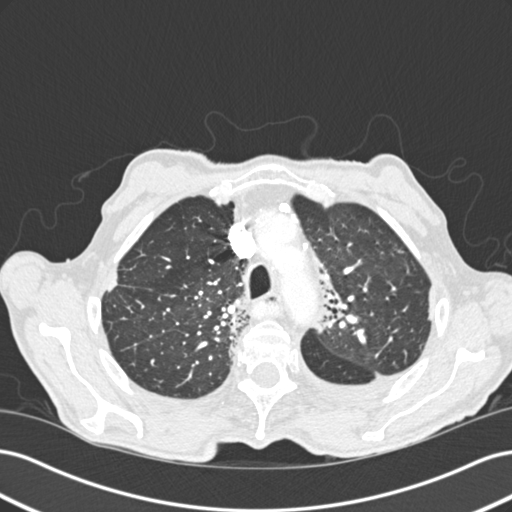
[im 246/291  lung]
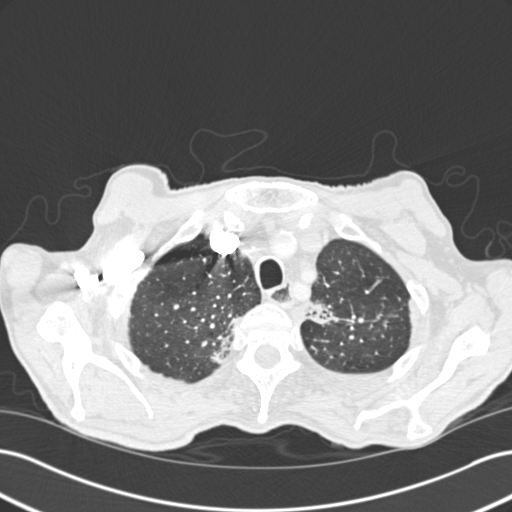
[im 268/291  lung]
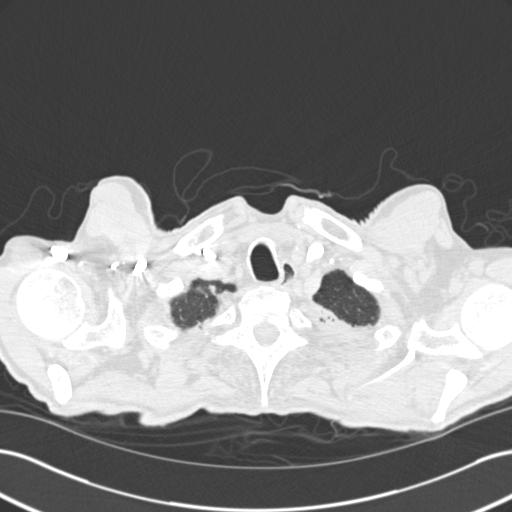

[12 of 30 positions shown; findings below may reference images not displayed]

FINDINGS: CTA CHEST FINDINGS

Mediastinum/Nodes: The visualized thyroid is unremarkable. The heart
is enlarged. Trace pericardial fluid. The ascending thoracic aorta
is dilated measuring 4.1 cm. The main pulmonary artery is dilated
measuring 4.1 cm. The esophagus is patulous and contains oral
contrast and fluid. 10 mm right gastroesophageal lymph node (image
83; series 5). Calcified left supraclavicular lymph node measuring
10 mm.

Adequate opacification of the main pulmonary artery for the
detection of pulmonary embolism. Acute embolism is demonstrated
within the right lower, right middle and right upper lobe segmental
pulmonary arteries. Small amount of peripheral emboli demonstrated
within the left lower lobe pulmonary arteries. The RV/LV ratio is
1.2 .

Lungs/Pleura: Central airways are patent. There is a moderate left
pleural effusion. Consolidative opacities are demonstrated within
the left lower lobe adjacent to the pleural effusion. Bandlike
retracted consolidative opacities are demonstrated within the
paramediastinal lungs bilaterally with associated bronchiectasis.
This may represent sequelae of radiation therapy. Additionally
within the peripheral right upper lobe there is a subpleural 2.4 x
1.1 cm nodule. Within the right middle lobe there is a 0.6 cm
subpleural nodule. No pneumothorax.

Musculoskeletal: No aggressive or acute appearing osseous lesions.
Multiple healed posterior left lateral lower rib fractures.

CT ABDOMEN and PELVIS FINDINGS

Hepatobiliary: There are innumerable low-attenuation lesions
throughout the liver most compatible with metastatic disease. The
largest of these lesions involves the left hepatic lobe and measures
9.2 x 9.1 cm. Reference low-attenuation lesion within the right
hepatic lobe measures 2.9 x 2.4 cm (image 28; series 4). Gallbladder
is decompressed.

Pancreas: Calcification within the pancreatic head. There is a 5.6 x
2.7 cm low-attenuation area involving the pancreatic body and tail
(image 20; series 4) which is favored to represent dilated
pancreatic duct.

Spleen: Surgically absent.

Adrenals/Urinary Tract: The adrenal glands are normal. Kidneys
enhance symmetrically with contrast. Within the superior pole of the
left kidney there is a 2.0 cm low-attenuation mass, indeterminate.

Stomach/Bowel: The colon is relatively decompressed. The appearance
of the cecum is favored to be secondary to underdistention. No
evidence for bowel obstruction. No free intraperitoneal air.

Vascular/Lymphatic: Infrarenal abdominal aortic ectasia measuring
2.5 cm. Peripheral calcified atherosclerotic plaque. Multiple
prominent and enlarged porta hepatic lymph nodes measuring up to
cm.

Other: There is a moderate to large amount of ascites throughout all
4 quadrants of the abdomen. Somewhat nodular appearance to the
greater omentum. Central dystrophic calcifications in the prostate.

Musculoskeletal: No aggressive or acute appearing osseous lesions.
Lower lumbar spine degenerative changes.

Review of the MIP images confirms the above findings.
IMPRESSION: Findings compatible with acute pulmonary embolism involving the
right and left lungs particularly the segmental pulmonary arteries.
Positive for acute PE with CT evidence of right heart strain (RV/LV
Ratio = 1.2) consistent with at least submassive (intermediate risk)
PE. The presence of right heart strain has been associated with an
increased risk of morbidity and mortality. Please activate Code PE
by paging [phone].

Low-attenuation masslike area within the pancreatic body and tail
which may represent ductal dilatation in the setting of obstructing
pancreatic mass which is concerning for adenocarcinoma. This may
represent the primary malignancy. Recommend further evaluation with
ERCP.

Multiple low-attenuation masses throughout the liver with the
largest in the left hepatic lobe measuring up to 9.2 cm, most
compatible with metastatic disease.

Moderate left pleural effusion. Consolidative opacities within the
left lower lobe potentially secondary to atelectasis. Infection not
excluded.

Subpleural nodules within the right upper and right middle lobes are
nonspecific and may be secondary to metastatic disease, pulmonary
infarct or postsurgical change in the appropriate clinical setting.

Bilateral paramediastinal consolidation with associated
bronchiectasis, favored to represent post radiation change with the
appropriate clinical history.

Large volume ascites. Somewhat nodular appearance of the omentum
which is nonspecific and may be secondary to the large volume
ascites. Peritoneal carcinomatosis is not excluded.

Indeterminate low-attenuation lesion within the superior pole of the
left kidney potentially representing a complicated cyst. Recommend
attention on followup.

Ascending thoracic aorta measures 4.1 cm. Recommend followup by
abdomen and pelvis CTA in 6 months, and vascular surgery
referral/consultation if not already obtained. This recommendation
follows ACR consensus guidelines: White Paper of the ACR Incidental
Findings Committee II on Vascular Findings. [HOSPITAL] 5302;
[DATE].

Dilation of the main pulmonary artery compatible with pulmonary
arterial hypertension.

Fluid throughout the esophagus as can be seen with reflux.

Critical Value/emergent results were called by telephone at the time
of interpretation on 07/03/2015 at [DATE] to Dr. MAYUMI DIAZ , who
verbally acknowledged these results.
